# Patient Record
Sex: Male | Born: 1980
Health system: Southern US, Community
[De-identification: ages and names within clinical notes are randomized; demographics above are authoritative.]

## PROBLEM LIST (undated history)

## (undated) DIAGNOSIS — K219 Gastro-esophageal reflux disease without esophagitis: Secondary | ICD-10-CM

## (undated) HISTORY — DX: Gastro-esophageal reflux disease without esophagitis: K21.9

---

## 2012-03-30 ENCOUNTER — Encounter: Payer: Self-pay | Admitting: Family Medicine

## 2012-03-30 ENCOUNTER — Ambulatory Visit (INDEPENDENT_AMBULATORY_CARE_PROVIDER_SITE_OTHER): Payer: No Typology Code available for payment source | Admitting: Family Medicine

## 2012-03-30 VITALS — BP 120/78 | HR 72 | Temp 98.7°F | Resp 12 | Ht 69.0 in | Wt 190.0 lb

## 2012-03-30 DIAGNOSIS — K219 Gastro-esophageal reflux disease without esophagitis: Secondary | ICD-10-CM

## 2012-03-30 DIAGNOSIS — Z8619 Personal history of other infectious and parasitic diseases: Secondary | ICD-10-CM

## 2012-03-30 MED ORDER — OMEPRAZOLE 20 MG PO CPDR: 20.0000 mg | DELAYED_RELEASE_CAPSULE | Freq: Every day | ORAL | Status: DC

## 2012-03-30 MED ORDER — VALACYCLOVIR HCL 1 G PO TABS: ORAL_TABLET | ORAL | Status: DC

## 2012-03-30 NOTE — Progress Notes (Signed)
  Subjective:    Patient ID: Gregory Sparks, male    DOB: [birth year], 31 y.o.   MRN: [id number]  HPI  New to establish. Generally very healthy. He has history of reflux been treated for several years with omeprazole 20 mg daily. Each time he tries to discontinue has breakthrough symptoms. Usually very cautious with foods. Nonsmoker. Rare alcohol. Very little caffeine consumption. Has had excellent control with omeprazole.  History of recurrent cold sores. Uses Valtrex as needed. No other chronic medical problems. No prior surgeries.  Family history significant for parents with hypertension. Uncle with type 2 diabetes. Patient is single. Nonsmoker. Works as an Product/process development scientist with the [employer]  Past Medical History  Diagnosis Date  . GERD (gastroesophageal reflux disease)    History reviewed. No pertinent past surgical history.  reports that he has never smoked. He does not have any smokeless tobacco history on file. His alcohol and drug histories not on file. family history includes Alcohol abuse in his father, maternal grandmother, and mother; Hyperlipidemia in his father and mother; and Hypertension in his father, maternal grandmother, and mother. No Known Allergies    Review of Systems  Constitutional: Negative for appetite change and unexpected weight change.  Respiratory: Negative for cough and shortness of breath.   Cardiovascular: Negative for chest pain.  Gastrointestinal: Negative for abdominal pain.  Genitourinary: Negative for dysuria.  Neurological: Negative for dizziness and headaches.       Objective:   Physical Exam  Constitutional: He is oriented to person, place, and time. He appears well-developed and well-nourished.  HENT:  Right Ear: External ear normal.  Left Ear: External ear normal.  Mouth/Throat: Oropharynx is clear and moist.  Neck: Neck supple. No thyromegaly present.  Cardiovascular: Normal rate and regular rhythm.   Pulmonary/Chest: Effort normal and  breath sounds normal. No respiratory distress. He has no wheezes. He has no rales.  Abdominal: Soft. He exhibits no mass. There is no tenderness. There is no rebound and no guarding.  Musculoskeletal: He exhibits no edema.  Neurological: He is alert and oriented to person, place, and time. No cranial nerve deficit.          Assessment & Plan:  #1 GERD. Refill omeprazole for one year. Dietary factors reviewed. #2 recurrent cold sores. Refilled Valtrex for as needed use

## 2012-03-31 ENCOUNTER — Encounter: Payer: Self-pay | Admitting: Family Medicine

## 2013-01-06 ENCOUNTER — Other Ambulatory Visit: Payer: Self-pay | Admitting: Family Medicine

## 2013-01-20 ENCOUNTER — Ambulatory Visit (INDEPENDENT_AMBULATORY_CARE_PROVIDER_SITE_OTHER): Payer: No Typology Code available for payment source | Admitting: Family Medicine

## 2013-01-20 ENCOUNTER — Encounter: Payer: Self-pay | Admitting: Family Medicine

## 2013-01-20 ENCOUNTER — Ambulatory Visit (INDEPENDENT_AMBULATORY_CARE_PROVIDER_SITE_OTHER)
Admission: RE | Admit: 2013-01-20 | Discharge: 2013-01-20 | Disposition: A | Discharge status: Home / Self Care | Payer: No Typology Code available for payment source | Source: Ambulatory Visit | Attending: Family Medicine | Admitting: Family Medicine

## 2013-01-20 VITALS — BP 130/84 | Temp 98.0°F | Wt 196.0 lb

## 2013-01-20 DIAGNOSIS — M898X8 Other specified disorders of bone, other site: Secondary | ICD-10-CM

## 2013-01-20 DIAGNOSIS — M899 Disorder of bone, unspecified: Secondary | ICD-10-CM

## 2013-01-20 DIAGNOSIS — M949 Disorder of cartilage, unspecified: Secondary | ICD-10-CM

## 2013-01-20 NOTE — Progress Notes (Signed)
  Subjective:    Patient ID: Gregory Sparks, male    DOB: August 20, 1981, 32 y.o.   MRN: [id number]  HPI Right hip pain-just above right iliac crest Onset November 2013.  First noted after running hard on treadmill-12MPH interval training. Pain is above iliac crest. Worse with running but still running some. Relief with rest. Pain is sharp and off and on.   Denies any lumbar back pain. No abdominal pain. Denies fever, chills, urinary symptoms, abdominal pain, appetite, or weight changes.  Pain is 5/10 intensity at its worst. Possibly mild relief with ibuprofen-though he rarely takes. He is able to do some exercises such as squats without difficulty  Past Medical History  Diagnosis Date  . GERD (gastroesophageal reflux disease)    No past surgical history on file.  reports that he has never smoked. He does not have any smokeless tobacco history on file. His alcohol and drug histories are not on file. family history includes Alcohol abuse in his father, maternal grandmother, and mother; Hyperlipidemia in his father and mother; and Hypertension in his father, maternal grandmother, and mother. No Known Allergies    Review of Systems  Constitutional: Negative for fever, chills, appetite change and unexpected weight change.  Cardiovascular: Negative for leg swelling.  Gastrointestinal: Negative for abdominal pain.  Genitourinary: Negative for dysuria.  Musculoskeletal: Negative for back pain.  Skin: Negative for rash.  Hematological: Negative for adenopathy.       Objective:   Physical Exam  Constitutional: He appears well-developed and well-nourished.  Cardiovascular: Normal rate and regular rhythm.   Pulmonary/Chest: Effort normal and breath sounds normal. No respiratory distress. He has no wheezes. He has no rales.  Abdominal: Soft. He exhibits no distension and no mass. There is no tenderness. There is no rebound and no guarding.  Musculoskeletal:  Full range of motion right hip  without difficulty He has some mild tenderness palpating the superior aspect right iliac crest. No masses palpated. No ecchymosis. No lateral hip tenderness over IT band          Assessment & Plan:  Patient presents with several month history of pain above right iliac crest first noted after running. He initially thought this was more of a muscular injury but this has lasted several months. Not having any true hip pain. No involvement of IT band. We'll get x-rays of pelvis given duration of symptoms. Consider referral to sports medicine clinic.

## 2013-01-21 NOTE — Progress Notes (Signed)
Quick Note:  Pt informed ______ 

## 2013-02-24 ENCOUNTER — Ambulatory Visit (INDEPENDENT_AMBULATORY_CARE_PROVIDER_SITE_OTHER): Payer: No Typology Code available for payment source | Admitting: Sports Medicine

## 2013-02-24 ENCOUNTER — Encounter: Payer: Self-pay | Admitting: Sports Medicine

## 2013-02-24 VITALS — BP 114/65 | Ht 69.0 in | Wt 185.0 lb

## 2013-02-24 DIAGNOSIS — M6289 Other specified disorders of muscle: Secondary | ICD-10-CM | POA: Insufficient documentation

## 2013-02-24 DIAGNOSIS — M6580 Other synovitis and tenosynovitis, unspecified site: Secondary | ICD-10-CM

## 2013-02-24 MED ORDER — NITROGLYCERIN 0.2 MG/HR TD PT24: MEDICATED_PATCH | TRANSDERMAL | Status: DC

## 2013-02-24 NOTE — Assessment & Plan Note (Signed)
Patient actually does have a tear in the tensor fascia lata. Patient will try and nitroglycerin patch and warned of potential side effects. Patient given home exercise program and her hand out to try. Discussed how to alter different lifting techniques. Patient will then followup again in 6 weeks to make sure that he continues to improve. At that time we should rescan the area of injury.

## 2013-02-24 NOTE — Patient Instructions (Signed)
Do the exercises we talked about Drop weight to 50% and increase by 10% weekly Nitroglycerin Protocol   Apply 1/4 nitroglycerin patch to affected area daily.  Change position of patch within the affected area every 24 hours.  You may experience a headache during the first 1-2 weeks of using the patch, these should subside.  If you experience headaches after beginning nitroglycerin patch treatment, you may take your preferred over the counter pain reliever.  Another side effect of the nitroglycerin patch is skin irritation or rash related to patch adhesive.  Please notify our office if you develop more severe headaches or rash, and stop the patch.  Tendon healing with nitroglycerin patch may require 12 to 24 weeks depending on the extent of injury.  Men should not use if taking Viagra, Cialis, or Levitra.   Do not use if you have migraines or rosacea.   Come back again in 6 weeks to make sure you are improving.

## 2013-02-24 NOTE — Progress Notes (Signed)
Chief complaint right hip pain  History of present illness: Patient is a 32 year old male coming in with right hip pain for several months duration. Patient states that he noticed it initially when he was running on a treadmill 12 miles per hour doing interval training. Patient states he did have pain but it seemed to subside but unfortunately 48 hours after that he started to have significant pain. Patient did take 3 weeks off which didn't completely resolved the pain in this area. Patient though since that time has resumed his regular activities and states that after activity as well as after sitting for long periods of time he can have pain on the lateral superior aspect of the hip. Patient states that this can be somewhat tender to palpation. Patient describes his pain as more of a tearing sensation. Patient has tried ice with some moderate improvement. Patient has not taken any over-the-counter medications.  Past Medical History  Diagnosis Date  . GERD (gastroesophageal reflux disease)    No past surgical history on file. Family History  Problem Relation Age of Onset  . Alcohol abuse Mother   . Hypertension Mother   . Hyperlipidemia Mother   . Alcohol abuse Father   . Hypertension Father   . Hyperlipidemia Father   . Alcohol abuse Maternal Grandmother   . Hypertension Maternal Grandmother    History   Social History  . Marital Status: Unknown    Spouse Name: N/A    Number of Children: N/A  . Years of Education: N/A   Social History Main Topics  . Smoking status: Never Smoker   . Smokeless tobacco: Not on file  . Alcohol Use: Not on file  . Drug Use: Not on file  . Sexually Active: Not on file   Other Topics Concern  . Not on file   Social History Narrative  . No narrative on file    Physical exam Blood pressure 114/65, height 5\' 9"  (1.753 m), weight 185 lb (83.915 kg). General: No apparent distress alert and oriented x3 mood and affect normal Respiratory: Patient's  speak in full sentences and does not appear short of breath Skin: Warm dry intact with no signs of infection or rash Neuro: Cranial nerves II through XII are intact, neurovascularly intact in all extremities with 2+ DTRs and 2+ pulses. Right hip exam: On inspection there is no gross deformity. Patient has full range of motion of the hip with no crepitus. Patient has no pain with labral testing. He is neurovascularly intact distally with 2+ DTRs. Patient though is somewhat tender to palpation over the iliac crest on the lateral aspect near the insertion of the anterior aspect of the tensor fascia lata. No swelling is noted. Patient on the skin does have a birthmark approximately 4 cm proximal to this area.  Musculoskeletal ultrasound was performed and interpreted by me today. Patient's in this vicinity of pain at the insertion of the tensor fossa left eye on the iliac crest shows an area of hypoechoic changes with calcific changes. This does have some neovascularization as well. This appeared the patient did have a minor tear in this area as well. The area of defect is approximately 1 cm in diameter. Patient also has an area on the underlying iliac crest appears to be him an avulsion fracture versus a nonunion.

## 2013-04-06 ENCOUNTER — Ambulatory Visit: Payer: No Typology Code available for payment source | Admitting: Sports Medicine

## 2013-04-09 ENCOUNTER — Other Ambulatory Visit: Payer: Self-pay | Admitting: Family Medicine

## 2013-04-28 ENCOUNTER — Encounter: Payer: Self-pay | Admitting: Sports Medicine

## 2013-04-28 ENCOUNTER — Ambulatory Visit (INDEPENDENT_AMBULATORY_CARE_PROVIDER_SITE_OTHER): Payer: No Typology Code available for payment source | Admitting: Sports Medicine

## 2013-04-28 VITALS — BP 132/82 | HR 67 | Ht 69.0 in | Wt 185.0 lb

## 2013-04-28 DIAGNOSIS — M6580 Other synovitis and tenosynovitis, unspecified site: Secondary | ICD-10-CM

## 2013-04-28 DIAGNOSIS — M6289 Other specified disorders of muscle: Secondary | ICD-10-CM

## 2013-04-28 NOTE — Patient Instructions (Signed)
Thank you for coming in today  Stop NTG  Wear insoles in all possible shoes  Exercises:  - Bar dips and lateral bending - Side planks  - Lateral leg lifts - Straight leg raises - Standing hip rotations  Return in 1 month if continued symptoms It is okay to run if you are not limping and your pain is < 3/10

## 2013-04-28 NOTE — Progress Notes (Signed)
CC: Followup right tensor fascia lata syndrome, new complaint of similar symptoms on the left. HPI: Patient is a pleasant 32 year old male who presents for followup today. When I last saw him he was complaining of right-sided hip pain at the ASIS. On ultrasound he has hypoechoic changes and a tear in the tensor fascia lata. He was started on the nitroglycerin protocol as well as home exercises. He states that his right hip is now 95% improved. He has completed the nitroglycerin. He states that overall his right side feels great. However, he tried to go for a short low intensity run on Saturday and ran about 2 miles at an 8-1/2 minute mile pace. He started to develop the same pain on the left side. He is concerned about why this is happening again.  ROS: As above in the HPI. All other systems are stable or negative.  OBJECTIVE: APPEARANCE:  Patient in no acute distress.The patient appeared well nourished and normally developed. HEENT: No scleral icterus. Conjunctiva non-injected Resp: Non labored Skin: No rash MSK:  Right hip: Full range of motion without pain. No tenderness to palpation over the ASIS or tendinous attachments. No pain on hip flexion against resistance or hip abduction against resistance. Strength is 5 out of 5. Left hip: Full range of motion without pain. There is tenderness to palpation near the ASIS. Patient has no pain and normal strength on hip flexion against resistance. There is pain on hip abduction against resistance with the hip in a slightly extended position.  Gait examination shows mild pronation of the feet bilaterally, right greater than left. Patient is noted to have hammering of his fourth and fifth toes on the right. He also has deviation of the second and third toes on the right foot  MSK [place]: Ultrasound of the right and left ASIS, iliac crest, and tensor fascia lata was performed in transverse and longitudinal views. There is near-complete resolution of the  hypoechoic change at the right iliac crest and ASIS. No tears visualized within the tensor fascial lata. On the left side there is increased hypoechoic change along the iliac crest just posterior to the ASIS. This is also the point of maximum tenderness.  ASSESSMENT: #1. Bilateral tensor fascia lata syndrome likely secondary to gait abnormality with over pronation   PLAN: Patient was given a sport insoles with small scaphoid pad to wear in his shoes. He was also given a increase home exercise plan to do bilaterally including bar dips and lateral bending as well as lateral leg lifts, straight leg raise, and standing hip rotation. He may continue his planks and core work. He may increase his activities so long as his pain is less than a 3/10 and he is not limping. He will followup in one month. Stop NTG

## 2013-06-08 ENCOUNTER — Ambulatory Visit: Payer: No Typology Code available for payment source | Admitting: Sports Medicine

## 2013-10-22 ENCOUNTER — Telehealth: Payer: Self-pay | Admitting: Family Medicine

## 2013-10-22 MED ORDER — VALACYCLOVIR HCL 1 G PO TABS: ORAL_TABLET | ORAL | Status: DC

## 2013-10-22 NOTE — Telephone Encounter (Signed)
Pharm request valACYclovir (VALTREX) 1000 MG tablet for pt.  Pt is transfering this rx and no refills. [facility]

## 2013-10-22 NOTE — Telephone Encounter (Signed)
RX sent to pharmacy  

## 2014-04-18 ENCOUNTER — Encounter: Payer: Self-pay | Admitting: Family Medicine

## 2014-04-18 ENCOUNTER — Ambulatory Visit (INDEPENDENT_AMBULATORY_CARE_PROVIDER_SITE_OTHER): Payer: No Typology Code available for payment source | Admitting: Family Medicine

## 2014-04-18 VITALS — BP 126/80 | HR 71 | Temp 98.1°F | Wt 201.0 lb

## 2014-04-18 DIAGNOSIS — H938X9 Other specified disorders of ear, unspecified ear: Secondary | ICD-10-CM

## 2014-04-18 DIAGNOSIS — H938X3 Other specified disorders of ear, bilateral: Secondary | ICD-10-CM

## 2014-04-18 NOTE — Patient Instructions (Signed)
Try over the counter decongestant such as Sudafed.   Follow up for any sudden hearing loss or other new symptoms.

## 2014-04-18 NOTE — Progress Notes (Signed)
Pre visit review using our clinic review tool, if applicable. No additional management support is needed unless otherwise documented below in the visit note. 

## 2014-04-18 NOTE — Progress Notes (Signed)
   Subjective:    Patient ID: Gregory Sparks, male    DOB: 06/21/1981, 33 y.o.   MRN: [id number]  Ear Fullness  Pertinent negatives include no ear discharge or sore throat.   Acute visit. Bilateral ear fullness off and on for a week. 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 He has a mild discomfort off and on. No drainage. No effusion or chills. Some nasal congestion. He is taking Claritin. Has not tried any decongestants.  Past Medical History  Diagnosis Date  . GERD (gastroesophageal reflux disease)    No past surgical history on file.  reports that he has never smoked. He does not have any smokeless tobacco history on file. His alcohol and drug histories are not on file. family history includes Alcohol abuse in his father, maternal grandmother, and mother; Hyperlipidemia in his father and mother; Hypertension in his father, maternal grandmother, and mother. No Known Allergies    Review of Systems  Constitutional: Negative for fever and chills.  HENT: Positive for congestion and ear pain. Negative for ear discharge and sore throat.        Objective:   Physical Exam  Constitutional: He appears well-developed and well-nourished.  HENT:  Right Ear: External ear normal.  Left Ear: External ear normal.  Mouth/Throat: Oropharynx is clear and moist.  Neck: Neck supple.  Lymphadenopathy:    He has no cervical adenopathy.          Assessment & Plan:  Bilateral ear pressure. Normal exam. No signs of effusion. No cerumen. Question eustachian tube dysfunction. We've recommended trying Sudafed and continue Claritin. Touch base in one to 2 weeks if no improvement

## 2014-04-27 ENCOUNTER — Other Ambulatory Visit: Payer: Self-pay

## 2014-04-27 MED ORDER — OMEPRAZOLE 20 MG PO CPDR: DELAYED_RELEASE_CAPSULE | ORAL | Status: DC

## 2014-07-05 ENCOUNTER — Encounter: Payer: Self-pay | Admitting: Family Medicine

## 2014-07-05 ENCOUNTER — Ambulatory Visit (INDEPENDENT_AMBULATORY_CARE_PROVIDER_SITE_OTHER): Payer: No Typology Code available for payment source | Admitting: Family Medicine

## 2014-07-05 VITALS — BP 130/80 | HR 84 | Temp 98.0°F | Wt 199.0 lb

## 2014-07-05 DIAGNOSIS — J3089 Other allergic rhinitis: Secondary | ICD-10-CM

## 2014-07-05 DIAGNOSIS — J309 Allergic rhinitis, unspecified: Secondary | ICD-10-CM

## 2014-07-05 MED ORDER — AZELASTINE HCL 0.1 % NA SOLN: 2.0000 | Freq: Two times a day (BID) | NASAL | Status: DC

## 2014-07-05 NOTE — Patient Instructions (Signed)

## 2014-07-05 NOTE — Progress Notes (Signed)
Pre visit review using our clinic review tool, if applicable. No additional management support is needed unless otherwise documented below in the visit note. 

## 2014-07-05 NOTE — Progress Notes (Signed)
   Subjective:    Patient ID: Gregory Sparks, male    DOB: 03/30/1981, 33 y.o.   MRN: [id number]  HPI Acute visit. Patient is seen with probable allergy symptoms. He's had several weeks of watery itchy eyes and nasal congestion and postnasal drip. Occasional nonproductive cough. No fever. His tried over-the-counter Claritin without much relief. He's taken Flonase, though somewhat inconsistently. He's not had any facial pain or any colored nasal discharge.His allergy symptoms tend to be year-round a thinks this is probably largely related to dust allergy  Past Medical History  Diagnosis Date  . GERD (gastroesophageal reflux disease)    No past surgical history on file.  reports that he has never smoked. He does not have any smokeless tobacco history on file. His alcohol and drug histories are not on file. family history includes Alcohol abuse in his father, maternal grandmother, and mother; Hyperlipidemia in his father and mother; Hypertension in his father, maternal grandmother, and mother. No Known Allergies    Review of Systems  Constitutional: Negative for fever and chills.  HENT: Positive for congestion, postnasal drip and rhinorrhea.   Respiratory: Positive for cough. Negative for shortness of breath and wheezing.        Objective:   Physical Exam  Constitutional: He appears well-developed and well-nourished.  HENT:  Right Ear: External ear normal.  Left Ear: External ear normal.  Nose: Nose normal.  Mouth/Throat: Oropharynx is clear and moist.  Neck: Neck supple.  Cardiovascular: Normal rate and regular rhythm.   Pulmonary/Chest: Effort normal and breath sounds normal. No respiratory distress. He has no wheezes. He has no rales.  Lymphadenopathy:    He has no cervical adenopathy.          Assessment & Plan:  Allergic rhinitis, perennial. Continue Claritin. Continue Flonase and take more regularly. Add Astelin 1-2 sprays per nostril twice daily as needed. Consider  Singulair if not responding to the above

## 2014-08-19 ENCOUNTER — Encounter: Payer: Self-pay | Admitting: Family Medicine

## 2014-08-19 ENCOUNTER — Ambulatory Visit (INDEPENDENT_AMBULATORY_CARE_PROVIDER_SITE_OTHER): Payer: No Typology Code available for payment source | Admitting: Family Medicine

## 2014-08-19 VITALS — BP 124/70 | HR 87 | Temp 98.4°F | Wt 199.0 lb

## 2014-08-19 DIAGNOSIS — J069 Acute upper respiratory infection, unspecified: Secondary | ICD-10-CM

## 2014-08-19 NOTE — Patient Instructions (Signed)
Viral Infections A viral infection can be caused by different types of viruses.Most viral infections are not serious and resolve on their own. However, some infections may cause severe symptoms and may lead to further complications. SYMPTOMS Viruses can frequently cause:  Minor sore throat.  Aches and pains.  Headaches.  Runny nose.  Different types of rashes.  Watery eyes.  Tiredness.  Cough.  Loss of appetite.  Gastrointestinal infections, resulting in nausea, vomiting, and diarrhea. These symptoms do not respond to antibiotics because the infection is not caused by bacteria. However, you might catch a bacterial infection following the viral infection. This is sometimes called a "superinfection." Symptoms of such a bacterial infection may include:  Worsening sore throat with pus and difficulty swallowing.  Swollen neck glands.  Chills and a high or persistent fever.  Severe headache.  Tenderness over the sinuses.  Persistent overall ill feeling (malaise), muscle aches, and tiredness (fatigue).  Persistent cough.  Yellow, green, or brown mucus production with coughing. HOME CARE INSTRUCTIONS   Only take over-the-counter or prescription medicines for pain, discomfort, diarrhea, or fever as directed by your caregiver.  Drink enough water and fluids to keep your urine clear or pale yellow. Sports drinks can provide valuable electrolytes, sugars, and hydration.  Get plenty of rest and maintain proper nutrition. Soups and broths with crackers or rice are fine. SEEK IMMEDIATE MEDICAL CARE IF:   You have severe headaches, shortness of breath, chest pain, neck pain, or an unusual rash.  You have uncontrolled vomiting, diarrhea, or you are unable to keep down fluids.  You or your child has an oral temperature above 102 F (38.9 C), not controlled by medicine.  Your baby is older than 3 months with a rectal temperature of 102 F (38.9 C) or higher.  Your baby is 673  months old or younger with a rectal temperature of 100.4 F (38 C) or higher. MAKE SURE YOU:   Understand these instructions.  Will watch your condition.  Will get help right away if you are not doing well or get worse. Document Released: 05/29/2005 Document Revised: 11/11/2011 Document Reviewed: 12/24/2010 [facility] Patient Information 2015 [place], [place]. This information is not intended to replace advice given to you by your health care provider. Make sure you discuss any questions you have with your health care provider.  Consider OTC Mucinex and Nettie pot with saline irrigation. Continue with decongestant as needed.

## 2014-08-19 NOTE — Progress Notes (Signed)
   Subjective:    Patient ID: Gregory Sparks, male    DOB: 08/02/1981, 33 y.o.   MRN: [id number]  HPI  acute visit. Patient seen with couple day history of cough, nasal congestion, possible low-grade fever, intermittent headaches, bilateral diffuse facial pressure and increased malaise. He's tried Claritin-D without much improvement. Denies any nausea, vomiting, or diarrhea. He has allergy tendencies and takes Astelin nasal at baseline.  Past Medical History  Diagnosis Date  . GERD (gastroesophageal reflux disease)    No past surgical history on file.  reports that he has never smoked. He does not have any smokeless tobacco history on file. His alcohol and drug histories are not on file. family history includes Alcohol abuse in his father, maternal grandmother, and mother; Hyperlipidemia in his father and mother; Hypertension in his father, maternal grandmother, and mother. No Known Allergies   Review of Systems  Constitutional: Positive for fever and fatigue.  HENT: Positive for congestion.   Respiratory: Positive for cough.   Neurological: Positive for headaches.       Objective:   Physical Exam  Constitutional: He appears well-developed and well-nourished.  HENT:  Right Ear: External ear normal.  Left Ear: External ear normal.  Mouth/Throat: Oropharynx is clear and moist.  Neck: Neck supple. No thyromegaly present.  Cardiovascular: Normal rate and regular rhythm.   Pulmonary/Chest: Effort normal and breath sounds normal. No respiratory distress. He has no wheezes. He has no rales.  Lymphadenopathy:    He has no cervical adenopathy.          Assessment & Plan:  Probable viral syndrome. We recommend continue  over-the-counter decongestant. Consider over-the-counter Mucinex. Consider Nettie pot with saline irrigation with distilled water.

## 2014-08-19 NOTE — Progress Notes (Signed)
Pre visit review using our clinic review tool, if applicable. No additional management support is needed unless otherwise documented below in the visit note. 

## 2014-09-30 ENCOUNTER — Other Ambulatory Visit: Payer: Self-pay | Admitting: Family Medicine

## 2014-11-09 ENCOUNTER — Ambulatory Visit (INDEPENDENT_AMBULATORY_CARE_PROVIDER_SITE_OTHER): Payer: No Typology Code available for payment source | Admitting: Internal Medicine

## 2014-11-09 ENCOUNTER — Encounter: Payer: Self-pay | Admitting: Internal Medicine

## 2014-11-09 VITALS — BP 130/80 | HR 95 | Temp 102.3°F | Resp 20 | Ht 69.0 in | Wt 204.0 lb

## 2014-11-09 DIAGNOSIS — R519 Headache, unspecified: Secondary | ICD-10-CM

## 2014-11-09 DIAGNOSIS — B9789 Other viral agents as the cause of diseases classified elsewhere: Principal | ICD-10-CM

## 2014-11-09 DIAGNOSIS — R51 Headache: Secondary | ICD-10-CM | POA: Diagnosis not present

## 2014-11-09 DIAGNOSIS — R509 Fever, unspecified: Secondary | ICD-10-CM

## 2014-11-09 DIAGNOSIS — R52 Pain, unspecified: Secondary | ICD-10-CM

## 2014-11-09 DIAGNOSIS — J069 Acute upper respiratory infection, unspecified: Secondary | ICD-10-CM | POA: Diagnosis not present

## 2014-11-09 LAB — POCT INFLUENZA A/B
Influenza A, POC: NEGATIVE
Influenza B, POC: NEGATIVE

## 2014-11-09 MED ORDER — HYDROCODONE-ACETAMINOPHEN 5-325 MG PO TABS: 1.0000 | ORAL_TABLET | Freq: Four times a day (QID) | ORAL | Status: DC | PRN

## 2014-11-09 MED ORDER — CETIRIZINE HCL 10 MG PO TABS: 10.0000 mg | ORAL_TABLET | Freq: Every day | ORAL | Status: DC

## 2014-11-09 NOTE — Progress Notes (Signed)
Subjective:    Patient ID: Gregory Sparks, male    DOB: 08-Nov-1980, 34 y.o.   MRN: [id number]  HPI 34 year old patient who presents with a day and a half history of fever, cough, generalized myalgias, headache.  Cough is minor and nonproductive.  He feels quite weak with the anorexia. He has recently returned from [place], planning his wedding and was in contact with people with acute febrile illness  Past Medical History  Diagnosis Date  . GERD (gastroesophageal reflux disease)     History   Social History  . Marital Status: Unknown    Spouse Name: N/A  . Number of Children: N/A  . Years of Education: N/A   Occupational History  . Not on file.   Social History Main Topics  . Smoking status: Never Smoker   . Smokeless tobacco: Not on file  . Alcohol Use: Not on file  . Drug Use: Not on file  . Sexual Activity: Not on file   Other Topics Concern  . Not on file   Social History Narrative    No past surgical history on file.  Family History  Problem Relation Age of Onset  . Alcohol abuse Mother   . Hypertension Mother   . Hyperlipidemia Mother   . Alcohol abuse Father   . Hypertension Father   . Hyperlipidemia Father   . Alcohol abuse Maternal Grandmother   . Hypertension Maternal Grandmother     No Known Allergies  Current Outpatient Prescriptions on File Prior to Visit  Medication Sig Dispense Refill  . omeprazole (PRILOSEC) 20 MG capsule take 1 capsule by mouth once daily 90 capsule 2  . valACYclovir (VALTREX) 1000 MG tablet USE AS DIRECTED IF NEEDED FOR COLD SORES 60 tablet 1   No current facility-administered medications on file prior to visit.    BP 130/80 mmHg  Pulse 95  Temp(Src) 102.3 F (39.1 C) (Oral)  Resp 20  Ht  (1.753 m)  Wt 204 lb (92.534 kg)  BMI 30.11 kg/m2  SpO2 98%      Review of Systems  Constitutional: Positive for fever, activity change, appetite change and fatigue. Negative for chills.  HENT: Positive for congestion.  Negative for dental problem, ear pain, hearing loss, sore throat, tinnitus, trouble swallowing and voice change.   Eyes: Negative for pain, discharge and visual disturbance.  Respiratory: Positive for cough. Negative for chest tightness, wheezing and stridor.   Cardiovascular: Negative for chest pain, palpitations and leg swelling.  Gastrointestinal: Negative for nausea, vomiting, abdominal pain, diarrhea, constipation, blood in stool and abdominal distention.  Genitourinary: Negative for urgency, hematuria, flank pain, discharge, difficulty urinating and genital sores.  Musculoskeletal: Positive for myalgias. Negative for back pain, joint swelling, arthralgias, gait problem and neck stiffness.  Skin: Negative for rash.  Neurological: Negative for dizziness, syncope, speech difficulty, weakness, numbness and headaches.  Hematological: Negative for adenopathy. Does not bruise/bleed easily.  Psychiatric/Behavioral: Negative for behavioral problems and dysphoric mood. The patient is not nervous/anxious.        Objective:   Physical Exam  Constitutional: He is oriented to person, place, and time. He appears well-developed.  HENT:  Head: Normocephalic.  Right Ear: External ear normal.  Left Ear: External ear normal.  Eyes: Conjunctivae and EOM are normal.  Neck: Normal range of motion.  Cardiovascular: Normal rate and normal heart sounds.   Pulmonary/Chest: Breath sounds normal. No respiratory distress. He has no wheezes. He has no rales.  Abdominal: Bowel sounds are normal.  Musculoskeletal: Normal range of motion. He exhibits no edema or tenderness.  Neurological: He is alert and oriented to person, place, and time.  Psychiatric: He has a normal mood and affect. His behavior is normal.          Assessment & Plan:

## 2014-11-09 NOTE — Progress Notes (Signed)
Pre visit review using our clinic review tool, if applicable. No additional management support is needed unless otherwise documented below in the visit note. 

## 2014-11-09 NOTE — Patient Instructions (Signed)
Acute bronchitis symptoms for less than 10 days are generally not helped by antibiotics.  Take over-the-counter expectorants and cough medications such as  Mucinex DM.  Call if there is no improvement in 5 to 7 days or if  you develop worsening cough, fever, or new symptoms, such as shortness of breath or chest pain.  TREATMENT  Acute bronchitis usually goes away in a couple weeks. Oftentimes, no medical treatment is necessary. Medicines are sometimes given for relief of fever or cough. Antibiotic medicines are usually not needed but may be prescribed in certain situations. In some cases, an inhaler may be recommended to help reduce shortness of breath and control the cough. A cool mist vaporizer may also be used to help thin bronchial secretions and make it easier to clear the chest.   HOME CARE INSTRUCTIONS  Get plenty of rest.  Drink enough fluids to keep your urine clear or pale yellow (unless you have a medical condition that requires fluid restriction). Increasing fluids may help thin your respiratory secretions (sputum) and reduce chest congestion, and it will prevent dehydration.  Take medicines only as directed by your health care provider.  Reduce the chances of another bout of acute bronchitis by washing your hands frequently, avoiding people with cold symptoms, and trying not to touch your hands to your mouth, nose, or eyes.   SEEK MEDICAL CARE IF:  Your symptoms do not improve after 1 week of treatment.

## 2015-03-15 ENCOUNTER — Other Ambulatory Visit: Payer: Self-pay | Admitting: Family Medicine

## 2015-11-06 ENCOUNTER — Ambulatory Visit (INDEPENDENT_AMBULATORY_CARE_PROVIDER_SITE_OTHER): Payer: No Typology Code available for payment source | Admitting: Family Medicine

## 2015-11-06 VITALS — BP 120/88 | HR 68 | Temp 97.8°F | Ht 69.0 in | Wt 200.4 lb

## 2015-11-06 DIAGNOSIS — R42 Dizziness and giddiness: Secondary | ICD-10-CM | POA: Diagnosis not present

## 2015-11-06 NOTE — Progress Notes (Signed)
   Subjective:    Patient ID: Gregory Sparks, male    DOB: 01/11/1981, 35 y.o.   MRN: [id number]  HPI Acute visit for vertigo. He had some very mild symptoms last week but especially worse on Sunday Symptoms are somewhat improved today. He describes vertigo symptoms that were worse with head movement and also worse supine. No clear directional component. No visual changes. No nausea. No vomiting. No headaches. No syncope. No hearing changes. No recent nasal congestion. Denies fever or chills. No focal weakness No prior history of vertigo .  Did have some mild headache over the weekend but none today  Past Medical History  Diagnosis Date  . GERD (gastroesophageal reflux disease)    No past surgical history on file.  reports that he has never smoked. He does not have any smokeless tobacco history on file. His alcohol and drug histories are not on file. family history includes Alcohol abuse in his father, maternal grandmother, and mother; Hyperlipidemia in his father and mother; Hypertension in his father, maternal grandmother, and mother. No Known Allergies    Review of Systems  Constitutional: Negative for fever and chills.  HENT: Negative for ear discharge, ear pain, hearing loss and tinnitus.   Eyes: Negative for visual disturbance.  Cardiovascular: Negative for chest pain.  Neurological: Positive for dizziness. Negative for seizures, syncope, weakness and headaches.  Psychiatric/Behavioral: Negative for confusion.       Objective:   Physical Exam  Constitutional: He is oriented to person, place, and time. He appears well-developed and well-nourished.  HENT:  Right Ear: External ear normal.  Left Ear: External ear normal.  Eyes: Pupils are equal, round, and reactive to light.  Neck: Neck supple.  Cardiovascular: Normal rate and regular rhythm.   No murmur heard. Pulmonary/Chest: Effort normal and breath sounds normal. No respiratory distress. He has no wheezes. He has no  rales.  Musculoskeletal: He exhibits no edema.  Lymphadenopathy:    He has no cervical adenopathy.  Neurological: He is alert and oriented to person, place, and time. No cranial nerve deficit. Coordination normal.  No focal strength deficits. Gait normal. Cerebellar normal by finger-nose testing No nystagmus.          Assessment & Plan:  Vertigo. Suspect benign peripheral positional vertigo. Nonfocal neuro exam at this time. Handout given. Follow-up for any recurrence or new symptoms. Handout on Epley maneuvers given. Consider vestibular rehabilitation if symptoms persist

## 2015-11-06 NOTE — Progress Notes (Signed)
Pre visit review using our clinic review tool, if applicable. No additional management support is needed unless otherwise documented below in the visit note. 

## 2015-11-06 NOTE — Patient Instructions (Signed)
Benign Positional Vertigo Vertigo is the feeling that you or your surroundings are moving when they are not. Benign positional vertigo is the most common form of vertigo. The cause of this condition is not serious (is benign). This condition is triggered by certain movements and positions (is positional). This condition can be dangerous if it occurs while you are doing something that could endanger you or others, such as driving.  CAUSES In many cases, the cause of this condition is not known. It may be caused by a disturbance in an area of the inner ear that helps your brain to sense movement and balance. This disturbance can be caused by a viral infection (labyrinthitis), head injury, or repetitive motion. RISK FACTORS This condition is more likely to develop in:  Women.  People who are 50 years of age or older. SYMPTOMS Symptoms of this condition usually happen when you move your head or your eyes in different directions. Symptoms may start suddenly, and they usually last for less than a minute. Symptoms may include:  Loss of balance and falling.  Feeling like you are spinning or moving.  Feeling like your surroundings are spinning or moving.  Nausea and vomiting.  Blurred vision.  Dizziness.  Involuntary eye movement (nystagmus). Symptoms can be mild and cause only slight annoyance, or they can be severe and interfere with daily life. Episodes of benign positional vertigo may return (recur) over time, and they may be triggered by certain movements. Symptoms may improve over time. DIAGNOSIS This condition is usually diagnosed by medical history and a physical exam of the head, neck, and ears. You may be referred to a health care provider who specializes in ear, nose, and throat (ENT) problems (otolaryngologist) or a provider who specializes in disorders of the nervous system (neurologist). You may have additional testing, including:  MRI.  A CT scan.  Eye movement tests. Your  health care provider may ask you to change positions quickly while he or she watches you for symptoms of benign positional vertigo, such as nystagmus. Eye movement may be tested with an electronystagmogram (ENG), caloric stimulation, the Dix-Hallpike test, or the roll test.  An electroencephalogram (EEG). This records electrical activity in your brain.  Hearing tests. TREATMENT Usually, your health care provider will treat this by moving your head in specific positions to adjust your inner ear back to normal. Surgery may be needed in severe cases, but this is rare. In some cases, benign positional vertigo may resolve on its own in 2-4 weeks. HOME CARE INSTRUCTIONS Safety  Move slowly.Avoid sudden body or head movements.  Avoid driving.  Avoid operating heavy machinery.  Avoid doing any tasks that would be dangerous to you or others if a vertigo episode would occur.  If you have trouble walking or keeping your balance, try using a cane for stability. If you feel dizzy or unstable, sit down right away.  Return to your normal activities as told by your health care provider. Ask your health care provider what activities are safe for you. General Instructions  Take over-the-counter and prescription medicines only as told by your health care provider.  Avoid certain positions or movements as told by your health care provider.  Drink enough fluid to keep your urine clear or pale yellow.  Keep all follow-up visits as told by your health care provider. This is important. SEEK MEDICAL CARE IF:  You have a fever.  Your condition gets worse or you develop new symptoms.  Your family or friends   notice any behavioral changes.  Your nausea or vomiting gets worse.  You have numbness or a "pins and needles" sensation. SEEK IMMEDIATE MEDICAL CARE IF:  You have difficulty speaking or moving.  You are always dizzy.  You faint.  You develop severe headaches.  You have weakness in your  legs or arms.  You have changes in your hearing or vision.  You develop a stiff neck.  You develop sensitivity to light.   This information is not intended to replace advice given to you by your health care provider. Make sure you discuss any questions you have with your health care provider.   Document Released: 05/27/2006 Document Revised: 05/10/2015 Document Reviewed: 12/12/2014 Elsevier Interactive Patient Education 2016 [employer] Inc.  

## 2015-11-10 ENCOUNTER — Ambulatory Visit (INDEPENDENT_AMBULATORY_CARE_PROVIDER_SITE_OTHER): Payer: No Typology Code available for payment source | Admitting: Family Medicine

## 2015-11-10 ENCOUNTER — Encounter: Payer: Self-pay | Admitting: Family Medicine

## 2015-11-10 VITALS — BP 110/88 | HR 72 | Temp 97.7°F | Ht 69.0 in | Wt 200.6 lb

## 2015-11-10 DIAGNOSIS — Z83438 Family history of other disorder of lipoprotein metabolism and other lipidemia: Secondary | ICD-10-CM

## 2015-11-10 DIAGNOSIS — Z8349 Family history of other endocrine, nutritional and metabolic diseases: Secondary | ICD-10-CM

## 2015-11-10 DIAGNOSIS — R42 Dizziness and giddiness: Secondary | ICD-10-CM

## 2015-11-10 NOTE — Progress Notes (Signed)
   Subjective:    Patient ID: Gregory Sparks, male    DOB: 01/25/1981, 35 y.o.   MRN: [id number]  HPI Here for the following issues  Seen earlier this week with some vertigo. Symptoms overall improved but not resolved. No new symptoms. Symptoms worse with movement. No clear directional component. No speech change. No swallowing difficulties. No focal weakness. No headache. No hearing changes. No ringing in the ears.  Patient has family history of hyperlipidemia and hypertension. No premature CAD history. Requesting fasting lipid panel. Nonsmoker. Exercises regularly.  Past Medical History  Diagnosis Date  . GERD (gastroesophageal reflux disease)    No past surgical history on file.  reports that he has never smoked. He does not have any smokeless tobacco history on file. His alcohol and drug histories are not on file. family history includes Alcohol abuse in his father, maternal grandmother, and mother; Hyperlipidemia in his father and mother; Hypertension in his father, maternal grandmother, and mother. No Known Allergies    Review of Systems  Constitutional: Negative for chills, fatigue and unexpected weight change.  HENT: Negative for congestion and hearing loss.   Eyes: Negative for visual disturbance.  Respiratory: Negative for cough, chest tightness and shortness of breath.   Cardiovascular: Negative for chest pain, palpitations and leg swelling.  Neurological: Positive for dizziness. Negative for syncope, weakness, light-headedness and headaches.       Objective:   Physical Exam  Constitutional: He is oriented to person, place, and time. He appears well-developed and well-nourished.  HENT:  Right Ear: External ear normal.  Left Ear: External ear normal.  Mouth/Throat: Oropharynx is clear and moist.  Eyes: Pupils are equal, round, and reactive to light.  Neck: Neck supple. No thyromegaly present.  Cardiovascular: Normal rate and regular rhythm.   Pulmonary/Chest: Effort  normal and breath sounds normal. No respiratory distress. He has no wheezes. He has no rales.  Musculoskeletal: He exhibits no edema.  Neurological: He is alert and oriented to person, place, and time. No cranial nerve deficit.          Assessment & Plan:  #1 persistent vertigo. Suspect benign peripheral positional vertigo. Symptoms overall improved. If not resolving by next week consider vestibular rehabilitation  #2 family history of hyperlipidemia. Patient has never had lipids checked. Requesting lipid panel and this will be done fasting.

## 2015-11-10 NOTE — Progress Notes (Signed)
Pre visit review using our clinic review tool, if applicable. No additional management support is needed unless otherwise documented below in the visit note. 

## 2015-11-11 LAB — TSH: TSH: 1.64 m[IU]/L (ref 0.40–4.50)

## 2015-11-11 LAB — BASIC METABOLIC PANEL
BUN: 14 mg/dL (ref 7–25)
CALCIUM: 9.4 mg/dL (ref 8.6–10.3)
CHLORIDE: 103 mmol/L (ref 98–110)
CO2: 28 mmol/L (ref 20–31)
CREATININE: 0.85 mg/dL (ref 0.60–1.35)
Glucose, Bld: 90 mg/dL (ref 65–99)
Potassium: 4.3 mmol/L (ref 3.5–5.3)
Sodium: 139 mmol/L (ref 135–146)

## 2015-11-11 LAB — CBC WITH DIFFERENTIAL/PLATELET
Basophils Absolute: 0.1 10*3/uL (ref 0.0–0.1)
Basophils Relative: 1 % (ref 0–1)
EOS ABS: 0.4 10*3/uL (ref 0.0–0.7)
EOS PCT: 7 % — AB (ref 0–5)
HEMATOCRIT: 44.6 % (ref 39.0–52.0)
HEMOGLOBIN: 14.9 g/dL (ref 13.0–17.0)
LYMPHS PCT: 38 % (ref 12–46)
Lymphs Abs: 2.2 10*3/uL (ref 0.7–4.0)
MCH: 29.4 pg (ref 26.0–34.0)
MCHC: 33.4 g/dL (ref 30.0–36.0)
MCV: 88.1 fL (ref 78.0–100.0)
MONO ABS: 0.6 10*3/uL (ref 0.1–1.0)
MPV: 12.2 fL (ref 8.6–12.4)
Monocytes Relative: 10 % (ref 3–12)
Neutro Abs: 2.6 10*3/uL (ref 1.7–7.7)
Neutrophils Relative %: 44 % (ref 43–77)
Platelets: 205 10*3/uL (ref 150–400)
RBC: 5.06 MIL/uL (ref 4.22–5.81)
RDW: 13.8 % (ref 11.5–15.5)
WBC: 5.8 10*3/uL (ref 4.0–10.5)

## 2015-11-11 LAB — HEPATIC FUNCTION PANEL
ALK PHOS: 33 U/L — AB (ref 40–115)
ALT: 16 U/L (ref 9–46)
AST: 20 U/L (ref 10–40)
Albumin: 4.5 g/dL (ref 3.6–5.1)
BILIRUBIN INDIRECT: 0.3 mg/dL (ref 0.2–1.2)
Bilirubin, Direct: 0.1 mg/dL (ref <=0.2)
TOTAL PROTEIN: 7.2 g/dL (ref 6.1–8.1)
Total Bilirubin: 0.4 mg/dL (ref 0.2–1.2)

## 2015-11-11 LAB — LIPID PANEL
CHOL/HDL RATIO: 6 ratio — AB (ref <=5.0)
Cholesterol: 191 mg/dL (ref 125–200)
HDL: 32 mg/dL — AB (ref >=40)
LDL Cholesterol: 136 mg/dL — ABNORMAL HIGH (ref <130)
Triglycerides: 113 mg/dL (ref <150)
VLDL: 23 mg/dL (ref <30)

## 2015-11-13 ENCOUNTER — Telehealth: Payer: Self-pay | Admitting: Family Medicine

## 2015-11-13 DIAGNOSIS — H8110 Benign paroxysmal vertigo, unspecified ear: Secondary | ICD-10-CM

## 2015-11-13 NOTE — Telephone Encounter (Signed)
Per last note pt is to follow up with vestibular rehabilitation. Would this be an ENT referral?

## 2015-11-13 NOTE — Telephone Encounter (Signed)
No.  He needs to see PT who does vestibular exercises for BPPV

## 2015-11-13 NOTE — Telephone Encounter (Signed)
Referral faxed

## 2015-11-13 NOTE — Telephone Encounter (Signed)
Pt is still having dizziness and would like to see a specialist for treatment. He was not sure of who he would need to see but none of his sx have resolved.

## 2015-11-13 NOTE — Telephone Encounter (Signed)
Pt following up on referral request.  Pt has found a office to see him in the morning.: [facility]. If we could send referral there today, and they could see him in the morning.  Fax: [phone] Phone :  [phone]  pls call if he can get the referral by end of today.

## 2015-11-14 ENCOUNTER — Ambulatory Visit: Payer: No Typology Code available for payment source | Attending: Family Medicine | Admitting: Physical Therapy

## 2015-11-14 ENCOUNTER — Other Ambulatory Visit: Payer: Self-pay | Admitting: Family Medicine

## 2015-11-14 ENCOUNTER — Encounter: Payer: Self-pay | Admitting: Physical Therapy

## 2015-11-14 DIAGNOSIS — R2681 Unsteadiness on feet: Secondary | ICD-10-CM | POA: Diagnosis present

## 2015-11-14 DIAGNOSIS — R42 Dizziness and giddiness: Secondary | ICD-10-CM | POA: Insufficient documentation

## 2015-11-14 DIAGNOSIS — H8110 Benign paroxysmal vertigo, unspecified ear: Secondary | ICD-10-CM

## 2015-11-14 NOTE — Patient Instructions (Addendum)
Gaze Stabilization: Tip Card  1.Target must remain in focus, not blurry, and appear stationary while head is in motion. 2.Perform exercises with small head movements (45 to either side of midline). 3.Increase speed of head motion so long as target is in focus. 4.If you wear eyeglasses, be sure you can see target through lens (therapist will give specific instructions for bifocal / progressive lenses). 5.These exercises may provoke dizziness or nausea. Work through these symptoms. If too dizzy, slow head movement slightly. Rest between each exercise. 6.Exercises demand concentration; avoid distractions. 7.For safety, perform standing exercises close to a counter, wall, corner, or next to someone.  Copyright  VHI. All rights reserved.  Gaze Stabilization: Standing Feet Apart    Feet shoulder width apart, keeping eyes on target on wall _6-8___ feet away, tilt head down 15-30 and move head side to side for _60___ seconds. Repeat while moving head up and down for _60___ seconds. Do _3-5___ sessions per day. Repeat using target on pattern background.  Copyright  VHI. All rights reserved.   

## 2015-11-14 NOTE — Therapy (Signed)
[facility] [address] [place], [place], [address] Phone: [phone]   Fax:  [phone]  Physical Therapy Evaluation  Patient Details  Name: Gregory Sparks MRN: [id number] Date of Birth: 1981/06/17 Referring Provider: Dr. Evelena Peat   Encounter Date: 11/14/2015      PT End of Session - 11/14/15 1059    Visit Number 1   Number of Visits 4   Date for PT Re-Evaluation 12/15/15   Authorization Type [employer]   PT Start Time 0930   PT Stop Time 1021   PT Time Calculation (min) 51 min      Past Medical History  Diagnosis Date  . GERD (gastroesophageal reflux disease)     History reviewed. No pertinent past surgical history.  There were no vitals filed for this visit.  Visit Diagnosis:  Dizziness and giddiness - Plan: PT plan of care cert/re-cert  Unsteadiness on feet - Plan: PT plan of care cert/re-cert      Subjective Assessment - 11/14/15 1042    Subjective Pt reports vertigo started last Sat., 11-04-15 around mid am - had nausea and vomiting and was unable to function during the day "I stayed in the chair all day"; pt states he was able to return to work on [employer] worked [employer]. but says Friday and the weekend were not very good days; says he feels better today: than he has the past few days:  rates vertigo intensity 4-5/10; states he has not driven since last Friday   Patient is accompained by: Family member  wife [name]   Pertinent History allergies, sinus problems; has hearing loss in L ear due to being in [employer]; has history of some tinnitus in L ear   Patient Stated Goals resolve the vertigo   Currently in Pain? No/denies            [facility] PT Assessment - 11/14/15 1047    Assessment   Medical Diagnosis Vertigo   Referring Provider Dr. Evelena Peat    Onset Date/Surgical Date 11/04/15   Precautions   Precautions Other (comment)  Vertigo   Balance Screen   Has the patient fallen in the past 6  months No   Has the patient had a decrease in activity level because of a fear of falling?  No   Is the patient reluctant to leave their home because of a fear of falling?  No   Prior Function   Level of Independence Independent   Neurosurgeon - works with spread sheets            Vestibular Assessment - 11/14/15 0001    Symptom Behavior   Type of Dizziness Imbalance   Frequency of Dizziness mostly constant but varies in intensity   Aggravating Factors Activity in general   Relieving Factors Lying supine;Head stationary   Occulomotor Exam   Occulomotor Alignment Normal   Visual Acuity   Static line 11   Dynamic line 4   Positional Testing   Sidelying Test Sidelying Right;Sidelying Left   Sidelying Right   Sidelying Right Duration no increased vertigo   Sidelying Left   Sidelying Left Duration no increased vertigo       Self Care; instructed in gaze stabilization exercise in standing - horizontal and vertical for HEP                PT Education - 11/14/15 1057    Education provided Yes   Education Details x1 viewing in standing;  Balance exercises - standing feet together and in partial tandem stance with EO and EC with head turns; walking with head turns in the home   Person(s) Educated Patient   Methods Explanation;Demonstration;Handout   Comprehension Verbalized understanding;Returned demonstration             PT Long Term Goals - 11/14/15 1701    PT LONG TERM GOAL #1   Title Pt will have </= 2 line difference with DVA to indicate improvement in gaze stabilization.  12-15-15   Baseline 7 line difference   Period Weeks   Status New   PT LONG TERM GOAL #2   Title Pt will report at least 50% improvement in vertigo.  12-15-15   Time 4   Period Weeks   Status New   PT LONG TERM GOAL #3   Title Complete SOT and establish goal as appropriate.  12-15-15   Time 4   Period Weeks   Status New   PT LONG TERM GOAL #4   Title  Independent in HEP for vestibular exercises.  12-15-15   Time 4   Period Weeks   Status New               Plan - 11/14/15 1655    Clinical Impression Statement Pt is a 35 year old male with spontaneous onset of vertigo starting on 11-04-15; pt does not have symptoms consistent with BPPV but more consistent with vestibular neuritis with significantly decreased gaze stabilization with a 7 line difference in DVA.  Pt reports feeling off balance and reports dizziness with head movement.                                                                                                                                                                                                                  Pt will benefit from skilled therapeutic intervention in order to improve on the following deficits Dizziness;Decreased balance   Rehab Potential Good   PT Frequency 1x / week   PT Duration 4 weeks   PT Treatment/Interventions Vestibular;Neuromuscular re-education;ADLs/Self Care Home Management;Therapeutic exercise;Therapeutic activities;Gait training;Patient/family education   PT Next Visit Plan do SOT and check HEP   PT Home Exercise Plan x1 viewing   Consulted and Agree with Plan of Care Patient         Problem List Patient Active Problem List   Diagnosis Date Noted  . Tensor fascia lata syndrome 02/24/2013  . GERD (gastroesophageal reflux disease) 03/30/2012  . H/O cold sores 03/30/2012  Kary Kos, PT 11/14/2015, 5:09 PM  [place] [facility] [address] [place], [place], [address] Phone: [phone]   Fax:  [phone]  Name: Gregory Sparks MRN: [id number] Date of Birth: 1980/12/17

## 2015-11-21 ENCOUNTER — Ambulatory Visit: Payer: No Typology Code available for payment source | Admitting: Family Medicine

## 2015-11-23 ENCOUNTER — Ambulatory Visit: Payer: No Typology Code available for payment source | Admitting: Physical Therapy

## 2015-11-23 DIAGNOSIS — R42 Dizziness and giddiness: Secondary | ICD-10-CM | POA: Diagnosis not present

## 2015-11-23 DIAGNOSIS — R2681 Unsteadiness on feet: Secondary | ICD-10-CM

## 2015-11-24 NOTE — Patient Instructions (Signed)
Balance: Eyes Open - Bilateral (Varied Surfaces)    Stand, feet shoulder width, eyes open. Maintain balance _60___ seconds. Repeat __2__ times per set. Do _1___ sets per session. Do __5_ sessions per week. Repeat on compliant surface: foam.  Copyright  VHI. All rights reserved.  Balance: Eyes Closed - Bilateral (Varied Surfaces)    Stand, feet shoulder width, close eyes. Maintain balance _60___ seconds. Repeat __2_ times per set. Do ___1_ sets per session. Do _5___ sessions per week. Repeat on compliant surface: foam.  Copyright  VHI. All rights reserved.

## 2015-11-24 NOTE — Therapy (Signed)
[facility] [address] [place], [place], [address] Phone: [phone]   Fax:  [phone]  Physical Therapy Treatment  Patient Details  Name: Gregory Sparks MRN: [id number] Date of Birth: 1981/08/31 Referring Provider: Dr. Evelena Peat   Encounter Date: 11/23/2015      PT End of Session - 11/24/15 1112    Visit Number 2   Number of Visits 4   Date for PT Re-Evaluation 12/15/15   Authorization Type UHC   PT Start Time 0813   PT Stop Time 0852   PT Time Calculation (min) 39 min      Past Medical History  Diagnosis Date  . GERD (gastroesophageal reflux disease)     No past surgical history on file.  There were no vitals filed for this visit.  Visit Diagnosis:  Dizziness and giddiness  Unsteadiness on feet      Subjective Assessment - 11/24/15 1106    Subjective Pt states he has no dizziness at this time - now it's "just my balance that's the problem"   Patient is accompained by: Family member   Pertinent History allergies, sinus problems; has hearing loss in L ear due to being in [employer]; has history of some tinnitus in L ear   Patient Stated Goals resolve the vertigo   Currently in Pain? No/denies           Sensory Organization Test score 73/100 for composite (N= 70/100);  Somatosensory slightly decr. At 84 with N = 90/100;  Normal visual and vestibular inputs  Condition 1;  All 3 trials WNL's Condition 2:  Trial 1 and 3 slightly decr. ; trial 2 WNL Condition 3:  Trials 1 and 2 slightly decr. ; trial 3 WNL Condition 4:  All 3 trials WNL Condition 5:  1 and 2 slightly decr. With 3 WNL Condition 6:  All 3 trials WNL    Dynamic Visual Acuity test - 5 line difference with pt reading line 6;  SVA line 11 (improved from initial 7 line difference)     Pt performed balance on foam exercises - EO and EC - feet apart and feet together - instructed in these for HEP Pt also performed SLS RLE and  LLE on foam for HEP - 10 sec hold with UE support prn            Balance Exercises - 11/24/15 1109    Balance Exercises: Standing   Standing Eyes Opened Narrow base of support (BOS);Wide (BOA);Head turns;Foam/compliant surface   Standing Eyes Closed Narrow base of support (BOS);Wide (BOA);Head turns;Foam/compliant surface   SLS Eyes open;Foam/compliant surface;Intermittent upper extremity support;1 rep           PT Education - 11/24/15 1111    Education provided Yes   Education Details added balance on foam exercises - 4 positions   Person(s) Educated Patient;Spouse   Methods Explanation;Demonstration;Handout   Comprehension Verbalized understanding;Returned demonstration             PT Long Term Goals - 11/14/15 1701    PT LONG TERM GOAL #1   Title Pt will have </= 2 line difference with DVA to indicate improvement in gaze stabilization.  12-15-15   Baseline 7 line difference   Period Weeks   Status New   PT LONG TERM GOAL #2   Title Pt will report at least 50% improvement in vertigo.  12-15-15   Time 4   Period Weeks   Status New   PT  LONG TERM GOAL #3   Title Complete SOT and establish goal as appropriate.  12-15-15   Time 4   Period Weeks   Status New   PT LONG TERM GOAL #4   Title Independent in HEP for vestibular exercises.  12-15-15   Time 4   Period Weeks   Status New               Plan - 11/24/15 1112    Clinical Impression Statement Pt improving with VOR function with 5 line difference with DVA rather than a 7 line difference as at time of initial evaluation; minimal deficits per SOT score   Pt will benefit from skilled therapeutic intervention in order to improve on the following deficits Dizziness;Decreased balance   Rehab Potential Good   PT Frequency 1x / week   PT Duration 4 weeks   PT Treatment/Interventions Vestibular;Neuromuscular re-education;ADLs/Self Care Home Management;Therapeutic exercise;Therapeutic activities;Gait  training;Patient/family education   PT Next Visit Plan redo SOT; check LTG's - D/C?   PT Home Exercise Plan x1 viewing; added balance on foam on 11-23-15   Consulted and Agree with Plan of Care Patient;Family member/caregiver   Family Member Consulted wife Megan        Problem List Patient Active Problem List   Diagnosis Date Noted  . Tensor fascia lata syndrome 02/24/2013  . GERD (gastroesophageal reflux disease) 03/30/2012  . H/O cold sores 03/30/2012    Kary KosDilday, Braxston Quinter Suzanne, PT 11/24/2015, 11:15 AM  [facility] [address] [place], [place], [address] Phone: [phone]   Fax:  [phone]  Name: Gregory Sparks MRN: [id number] Date of Birth: 04/21/1981

## 2015-12-11 ENCOUNTER — Other Ambulatory Visit: Payer: Self-pay | Admitting: Allergy and Immunology

## 2015-12-12 ENCOUNTER — Other Ambulatory Visit: Payer: Self-pay | Admitting: Allergy

## 2015-12-12 MED ORDER — CETIRIZINE HCL 10 MG PO TABS: 10.0000 mg | ORAL_TABLET | Freq: Every day | ORAL | Status: DC

## 2015-12-18 ENCOUNTER — Ambulatory Visit: Payer: No Typology Code available for payment source | Admitting: Physical Therapy

## 2016-01-02 ENCOUNTER — Ambulatory Visit: Payer: No Typology Code available for payment source | Admitting: Physical Therapy

## 2016-01-09 ENCOUNTER — Ambulatory Visit: Payer: No Typology Code available for payment source | Attending: Family Medicine | Admitting: Physical Therapy

## 2016-01-09 DIAGNOSIS — R42 Dizziness and giddiness: Secondary | ICD-10-CM | POA: Diagnosis present

## 2016-01-14 NOTE — Therapy (Signed)
[place] [address] [place], [place], [address] Phone: [phone]   Fax:  [phone]  Physical Therapy Treatment  Patient Details  Name: Gregory Sparks MRN: [id number] Date of Birth: 07/03/1981 Referring Provider: Dr. Carolann Littler   Encounter Date: 01/09/2016      PT End of Session - 01/14/16 1916    Visit Number 3   Number of Visits 4   Date for PT Re-Evaluation 12/15/15   Authorization Type UHC   PT Start Time 0802   PT Stop Time 0846   PT Time Calculation (min) 44 min      Past Medical History  Diagnosis Date  . GERD (gastroesophageal reflux disease)     No past surgical history on file.  There were no vitals filed for this visit.      Subjective Assessment - 01/14/16 1853    Subjective Pt states he is so much better - only a little bit of dizziness when he turns his head quickly but not much at this time   Patient is accompained by: Family member   Pertinent History allergies, sinus problems; has hearing loss in L ear due to being in [employer]; has history of some tinnitus in L ear   Patient Stated Goals resolve the vertigo   Currently in Pain? No/denies      Sensory Organization Test - score 82/100 composite (WNL's)  Somatosensory, visual and vestibular inputs WNL's  Dynamic visual acuity test - 3 line difference from static visual acuity  Self Care - discussed LTG's and progress towards goals - reviewed HEP with recommendation to cont. With x1 viewing exercise for additional 3-4 weeks                                PT Long Term Goals - 01/14/16 1916    PT LONG TERM GOAL #1   Title Pt will have </= 2 line difference with DVA to indicate improvement in gaze stabilization.  12-15-15   Baseline 3 line difference - 01-09-16   Status Partially Met   PT LONG TERM GOAL #2   Title Pt will report at least 50% improvement in vertigo.  12-15-15   Status Achieved   PT LONG  TERM GOAL #3   Title Complete SOT and establish goal as appropriate.  12-15-15   Status Achieved   PT LONG TERM GOAL #4   Title Independent in HEP for vestibular exercises.  12-15-15   Status Achieved             Patient will benefit from skilled therapeutic intervention in order to improve the following deficits and impairments:     Visit Diagnosis: Dizziness and giddiness     Problem List Patient Active Problem List   Diagnosis Date Noted  . Tensor fascia lata syndrome 02/24/2013  . GERD (gastroesophageal reflux disease) 03/30/2012  . H/O cold sores 03/30/2012    PHYSICAL THERAPY DISCHARGE SUMMARY  Visits from Start of Care: 3  Current functional level related to goals / functional outcomes: See above for progress towards goals   Remaining deficits: Slightly abnormal gaze stabilization with a 3 line difference in dynamic visual acuity test   Education / Equipment: Pt has been instructed in a HEP for vestibular exercises and gaze stabilization exercise.  Plan: Patient agrees to discharge.  Patient goals were partially met. Patient is being discharged due to meeting the stated rehab goals.  ?????  Alda Lea, PT 01/14/2016, 7:20 PM  [place] [address] [place], [place], [address] Phone: [phone]   Fax:  [phone]  Name: Gregory Sparks MRN: [id number] Date of Birth: 02-Nov-1980

## 2016-08-12 ENCOUNTER — Encounter: Payer: Self-pay | Admitting: Family Medicine

## 2016-08-12 ENCOUNTER — Ambulatory Visit (INDEPENDENT_AMBULATORY_CARE_PROVIDER_SITE_OTHER): Payer: No Typology Code available for payment source | Admitting: Family Medicine

## 2016-08-12 ENCOUNTER — Other Ambulatory Visit: Payer: Self-pay | Admitting: Allergy

## 2016-08-12 VITALS — BP 90/60 | HR 66 | Temp 98.2°F | Ht 69.0 in | Wt 198.0 lb

## 2016-08-12 DIAGNOSIS — M545 Low back pain, unspecified: Secondary | ICD-10-CM

## 2016-08-12 MED ORDER — METHOCARBAMOL 500 MG PO TABS: 500.0000 mg | ORAL_TABLET | Freq: Three times a day (TID) | ORAL | 0 refills | Status: DC | PRN

## 2016-08-12 NOTE — Patient Instructions (Signed)
Low Back Sprain Rehab  Ask your health care provider which exercises are safe for you. Do exercises exactly as told by your health care provider and adjust them as directed. It is normal to feel mild stretching, pulling, tightness, or discomfort as you do these exercises, but you should stop right away if you feel sudden pain or your pain gets worse. Do not begin these exercises until told by your health care provider.  Stretching and range of motion exercises  These exercises warm up your muscles and joints and improve the movement and flexibility of your back. These exercises also help to relieve pain, numbness, and tingling.  Exercise A: Lumbar rotation     1. Lie on your back on a firm surface and bend your knees.  2. Straighten your arms out to your sides so each arm forms an "L" shape with a side of your body (a 90 degree angle).  3. Slowly move both of your knees to one side of your body until you feel a stretch in your lower back. Try not to let your shoulders move off of the floor.  4. Hold for __________ seconds.  5. Tense your abdominal muscles and slowly move your knees back to the starting position.  6. Repeat this exercise on the other side of your body.  Repeat __________ times. Complete this exercise __________ times a day.  Exercise B: Prone extension on elbows     1. Lie on your abdomen on a firm surface.  2. Prop yourself up on your elbows.  3. Use your arms to help lift your chest up until you feel a gentle stretch in your abdomen and your lower back.  ? This will place some of your body weight on your elbows. If this is uncomfortable, try stacking pillows under your chest.  ? Your hips should stay down, against the surface that you are lying on. Keep your hip and back muscles relaxed.  4. Hold for __________ seconds.  5. Slowly relax your upper body and return to the starting position.  Repeat __________ times. Complete this exercise __________ times a day.  Strengthening exercises  These  exercises build strength and endurance in your back. Endurance is the ability to use your muscles for a long time, even after they get tired.  Exercise C: Pelvic tilt   1. Lie on your back on a firm surface. Bend your knees and keep your feet flat.  2. Tense your abdominal muscles. Tip your pelvis up toward the ceiling and flatten your lower back into the floor.  ? To help with this exercise, you may place a small towel under your lower back and try to push your back into the towel.  3. Hold for __________ seconds.  4. Let your muscles relax completely before you repeat this exercise.  Repeat __________ times. Complete this exercise __________ times a day.  Exercise D: Alternating arm and leg raises     1. Get on your hands and knees on a firm surface. If you are on a hard floor, you may want to use padding to cushion your knees, such as an exercise mat.  2. Line up your arms and legs. Your hands should be below your shoulders, and your knees should be below your hips.  3. Lift your left leg behind you. At the same time, raise your right arm and straighten it in front of you.  ? Do not lift your leg higher than your hip.  ? Do   not lift your arm higher than your shoulder.  ? Keep your abdominal and back muscles tight.  ? Keep your hips facing the ground.  ? Do not arch your back.  ? Keep your balance carefully, and do not hold your breath.  4. Hold for __________ seconds.  5. Slowly return to the starting position and repeat with your right leg and your left arm.  Repeat __________ times. Complete this exercise __________ times a day.  Exercise E: Abdominal set with straight leg raise     1. Lie on your back on a firm surface.  2. Bend one of your knees and keep your other leg straight.  3. Tense your abdominal muscles and lift your straight leg up, 4-6 inches (10-15 cm) off the ground.  4. Keep your abdominal muscles tight and hold for __________ seconds.  ? Do not hold your breath.  ? Do not arch your back. Keep it  flat against the ground.  5. Keep your abdominal muscles tense as you slowly lower your leg back to the starting position.  6. Repeat with your other leg.  Repeat __________ times. Complete this exercise __________ times a day.  Posture and body mechanics     Body mechanics refers to the movements and positions of your body while you do your daily activities. Posture is part of body mechanics. Good posture and healthy body mechanics can help to relieve stress in your body's tissues and joints. Good posture means that your spine is in its natural S-curve position (your spine is neutral), your shoulders are pulled back slightly, and your head is not tipped forward. The following are general guidelines for applying improved posture and body mechanics to your everyday activities.  Standing     · When standing, keep your spine neutral and your feet about hip-width apart. Keep a slight bend in your knees. Your ears, shoulders, and hips should line up.  · When you do a task in which you stand in one place for a long time, place one foot up on a stable object that is 2-4 inches (5-10 cm) high, such as a footstool. This helps keep your spine neutral.  Sitting     · When sitting, keep your spine neutral and keep your feet flat on the floor. Use a footrest, if necessary, and keep your thighs parallel to the floor. Avoid rounding your shoulders, and avoid tilting your head forward.  · When working at a desk or a computer, keep your desk at a height where your hands are slightly lower than your elbows. Slide your chair under your desk so you are close enough to maintain good posture.  · When working at a computer, place your monitor at a height where you are looking straight ahead and you do not have to tilt your head forward or downward to look at the screen.  Resting     · When lying down and resting, avoid positions that are most painful for you.  · If you have pain with activities such as sitting, bending, stooping, or  squatting (flexion-based activities), lie in a position in which your body does not bend very much. For example, avoid curling up on your side with your arms and knees near your chest (fetal position).  · If you have pain with activities such as standing for a long time or reaching with your arms (extension-based activities), lie with your spine in a neutral position and bend your knees slightly. Try the following   positions:  · Lying on your side with a pillow between your knees.  · Lying on your back with a pillow under your knees.  Lifting     · When lifting objects, keep your feet at least shoulder-width apart and tighten your abdominal muscles.  · Bend your knees and hips and keep your spine neutral. It is important to lift using the strength of your legs, not your back. Do not lock your knees straight out.  · Always ask for help to lift heavy or awkward objects.  This information is not intended to replace advice given to you by your health care provider. Make sure you discuss any questions you have with your health care provider.  Document Released: 08/19/2005 Document Revised: 04/25/2016 Document Reviewed: 05/31/2015  Elsevier Interactive Patient Education © 2017 [employer] Inc[employer]

## 2016-08-12 NOTE — Progress Notes (Signed)
Subjective:     Patient ID: Gregory Sparks, male   DOB: 07/24/1981, 35 y.o.   MRN: [id number]  HPI Patient seen with one-week history of low back pain. His pain somewhat midline but radiates out bilaterally. He started playing some golf recently and wonders if this is related to golf swing but denies any specific injury. He noticed one day after getting up he felt like his back was "crooked ". He feels that he may have some back spasm. Pain is constant and achy to sharp quality. Worse with back flexion. He's tried some Aleve, topical heat, and topical mentholated cream without much improvement. No urine or stool incontinence. Denies lower extremity weakness or numbness. No prior history of back difficulties.  Past Medical History:  Diagnosis Date  . GERD (gastroesophageal reflux disease)    No past surgical history on file.  reports that he has never smoked. He does not have any smokeless tobacco history on file. His alcohol and drug histories are not on file. family history includes Alcohol abuse in his father, maternal grandmother, and mother; Hyperlipidemia in his father and mother; Hypertension in his father, maternal grandmother, and mother. No Known Allergies   Review of Systems  Constitutional: Negative for activity change, appetite change and fever.  Respiratory: Negative for cough and shortness of breath.   Cardiovascular: Negative for chest pain and leg swelling.  Gastrointestinal: Negative for abdominal pain and vomiting.  Genitourinary: Negative for dysuria, flank pain and hematuria.  Musculoskeletal: Positive for back pain. Negative for joint swelling.  Neurological: Negative for weakness and numbness.       Objective:   Physical Exam  Constitutional: He is oriented to person, place, and time. He appears well-developed and well-nourished. No distress.  Neck: No thyromegaly present.  Cardiovascular: Normal rate, regular rhythm and normal heart sounds.   No murmur  heard. Pulmonary/Chest: Effort normal and breath sounds normal. No respiratory distress. He has no wheezes. He has no rales.  Musculoskeletal: He exhibits no edema.  Straight leg raise are negative bilaterally  Neurological: He is alert and oriented to person, place, and time. He has normal reflexes. No cranial nerve deficit.  Full-strength lower extremities and symmetric reflexes ankle and knee  Skin: No rash noted.       Assessment:     Low back pain. Suspect musculoskeletal strain. Nonfocal exam neurologically    Plan:     -Discussed back stretches with handout given -Continue heat and/or ice for symptomatic relief -Trial of Robaxin 500 mg 1-2 daily at bedtime when necessary -Continue over-the-counter Aleve -Consider physical therapy trial if not improving over the next couple of weeks  Kristian CoveyBruce W Pesach Frisch MD [facility]

## 2016-10-01 ENCOUNTER — Other Ambulatory Visit: Payer: Self-pay | Admitting: Family Medicine

## 2016-10-01 ENCOUNTER — Other Ambulatory Visit: Payer: Self-pay

## 2016-10-08 ENCOUNTER — Ambulatory Visit (INDEPENDENT_AMBULATORY_CARE_PROVIDER_SITE_OTHER): Payer: No Typology Code available for payment source | Admitting: Allergy and Immunology

## 2016-10-08 ENCOUNTER — Encounter: Payer: Self-pay | Admitting: Allergy and Immunology

## 2016-10-08 VITALS — BP 120/78 | HR 64 | Resp 16 | Ht 69.0 in | Wt 200.6 lb

## 2016-10-08 DIAGNOSIS — J3089 Other allergic rhinitis: Secondary | ICD-10-CM | POA: Diagnosis not present

## 2016-10-08 MED ORDER — FLUTICASONE PROPIONATE 50 MCG/ACT NA SUSP: 1.0000 | Freq: Two times a day (BID) | NASAL | 5 refills | Status: DC

## 2016-10-08 MED ORDER — CETIRIZINE HCL 10 MG PO TABS: 10.0000 mg | ORAL_TABLET | Freq: Every day | ORAL | 5 refills | Status: DC

## 2016-10-08 NOTE — Assessment & Plan Note (Deleted)
   Continue appropriate allergen avoidance measures, fluticasone nasal spray as needed, and cetirizine 10 mg daily as needed.  I have also recommended nasal saline spray (i.e., Simply Saline) or nasal saline lavage (i.e., NeilMed) as needed prior to medicated nasal sprays.  Refill prescriptions have been provided for fluticasone nasal spray and cetirizine. 

## 2016-10-08 NOTE — Assessment & Plan Note (Signed)
   Continue appropriate allergen avoidance measures, fluticasone nasal spray as needed, and cetirizine 10 mg daily as needed.  I have also recommended nasal saline spray (i.e., Simply Saline) or nasal saline lavage (i.e., NeilMed) as needed prior to medicated nasal sprays.  Refill prescriptions have been provided for fluticasone nasal spray and cetirizine.

## 2016-10-08 NOTE — Patient Instructions (Signed)
Allergic rhinitis  Continue appropriate allergen avoidance measures, fluticasone nasal spray as needed, and cetirizine 10 mg daily as needed.  I have also recommended nasal saline spray (i.e., Simply Saline) or nasal saline lavage (i.e., NeilMed) as needed prior to medicated nasal sprays.  Refill prescriptions have been provided for fluticasone nasal spray and cetirizine.   Return in about 1 year (around 10/08/2017), or if symptoms worsen or fail to improve.

## 2016-10-08 NOTE — Progress Notes (Signed)
    Follow-up Note  RE: Gregory Sparks [place] MRN: [id number] DOB: 07/24/1981 Date of Office Visit: 10/08/2016  Primary care provider: Kristian CoveyBURCHETTE,BRUCE W, MD Referring provider: Kristian CoveyBurchette, Bruce W, MD  History of present illness: Gregory Sparks Po[place]ra is a 36 y.o. male with perennial allergic rhinitis presents today for follow up.  He was last seen in this clinic in December 2015.  He reports that his nasal allergy symptoms have been well-controlled with fluticasone nasal spray as needed and/or cetirizine as needed.  He has no nasal symptom complaints.  He needs refills on his medications today.   Assessment and plan: Allergic rhinitis  Continue appropriate allergen avoidance measures, fluticasone nasal spray as needed, and cetirizine 10 mg daily as needed.  I have also recommended nasal saline spray (i.e., Simply Saline) or nasal saline lavage (i.e., NeilMed) as needed prior to medicated nasal sprays.  Refill prescriptions have been provided for fluticasone nasal spray and cetirizine.   Meds ordered this encounter  Medications  . fluticasone (FLONASE) 50 MCG/ACT nasal spray    Sig: Place 1 spray into both nostrils 2 (two) times daily.    Dispense:  16 g    Refill:  5  . cetirizine (ZYRTEC) 10 MG tablet    Sig: Take 1 tablet (10 mg total) by mouth daily.    Dispense:  30 tablet    Refill:  5    Physical examination: Blood pressure 120/78, pulse 64, resp. rate 16, height 5\' 9"  (1.753 m), weight 200 lb 9.6 oz (91 kg).  General: Alert, interactive, in no acute distress. HEENT: TMs pearly gray, turbinates minimally edematous without discharge, post-pharynx unremarkable. Neck: Supple without lymphadenopathy. Lungs: Clear to auscultation without wheezing, rhonchi or rales. CV: Normal S1, S2 without murmurs. Skin: Warm and dry, without lesions or rashes.  The following portions of the patient's history were reviewed and updated as appropriate: allergies, current medications, past family history, past  medical history, past social history, past surgical history and problem list.  Allergies as of 10/08/2016   No Known Allergies     Medication List       Accurate as of 10/08/16  7:28 PM. Always use your most recent med list.          cetirizine 10 MG tablet Commonly known as:  ZYRTEC Take 1 tablet (10 mg total) by mouth daily.   fluticasone 50 MCG/ACT nasal spray Commonly known as:  FLONASE Place 1 spray into both nostrils 2 (two) times daily.   valACYclovir 1000 MG tablet Commonly known as:  VALTREX USE AS DIRECTED IF NEEDED FOR COLD SORES       No Known Allergies  I appreciate the opportunity to take part in Brant's care. Please do not hesitate to contact me with questions.  Sincerely,   R. Jorene Guestarter Saanvika Vazques, MD

## 2017-07-17 ENCOUNTER — Other Ambulatory Visit: Payer: Self-pay | Admitting: Family Medicine

## 2017-09-21 ENCOUNTER — Other Ambulatory Visit: Payer: Self-pay | Admitting: Family Medicine

## 2018-02-23 ENCOUNTER — Ambulatory Visit: Payer: No Typology Code available for payment source | Admitting: Family Medicine

## 2018-02-23 ENCOUNTER — Encounter: Payer: Self-pay | Admitting: Family Medicine

## 2018-02-23 VITALS — BP 110/80 | HR 68 | Temp 98.5°F | Wt 190.2 lb

## 2018-02-23 DIAGNOSIS — H8112 Benign paroxysmal vertigo, left ear: Secondary | ICD-10-CM

## 2018-02-23 NOTE — Patient Instructions (Signed)
Vertigo °Vertigo is the feeling that you or your surroundings are moving when they are not. Vertigo can be dangerous if it occurs while you are doing something that could endanger you or others, such as driving. °What are the causes? °This condition is caused by a disturbance in the signals that are sent by your body’s sensory systems to your brain. Different causes of a disturbance can lead to vertigo, including: °· Infections, especially in the inner ear. °· A bad reaction to a drug, or misuse of alcohol and medicines. °· Withdrawal from drugs or alcohol. °· Quickly changing positions, as when lying down or rolling over in bed. °· Migraine headaches. °· Decreased blood flow to the brain. °· Decreased blood pressure. °· Increased pressure in the brain from a head or neck injury, stroke, infection, tumor, or bleeding. °· Central nervous system disorders. ° °What are the signs or symptoms? °Symptoms of this condition usually occur when you move your head or your eyes in different directions. Symptoms may start suddenly, and they usually last for less than a minute. Symptoms may include: °· Loss of balance and falling. °· Feeling like you are spinning or moving. °· Feeling like your surroundings are spinning or moving. °· Nausea and vomiting. °· Blurred vision or double vision. °· Difficulty hearing. °· Slurred speech. °· Dizziness. °· Involuntary eye movement (nystagmus). ° °Symptoms can be mild and cause only slight annoyance, or they can be severe and interfere with daily life. Episodes of vertigo may return (recur) over time, and they are often triggered by certain movements. Symptoms may improve over time. °How is this diagnosed? °This condition may be diagnosed based on medical history and the quality of your nystagmus. Your health care provider may test your eye movements by asking you to quickly change positions to trigger the nystagmus. This may be called the Dix-Hallpike test, head thrust test, or roll test.  You may be referred to a health care provider who specializes in ear, nose, and throat (ENT) problems (otolaryngologist) or a provider who specializes in disorders of the central nervous system (neurologist). °You may have additional testing, including: °· A physical exam. °· Blood tests. °· MRI. °· A CT scan. °· An electrocardiogram (ECG). This records electrical activity in your heart. °· An electroencephalogram (EEG). This records electrical activity in your brain. °· Hearing tests. ° °How is this treated? °Treatment for this condition depends on the cause and the severity of the symptoms. Treatment options include: °· Medicines to treat nausea or vertigo. These are usually used for severe cases. Some medicines that are used to treat other conditions may also reduce or eliminate vertigo symptoms. These include: °? Medicines that control allergies (antihistamines). °? Medicines that control seizures (anticonvulsants). °? Medicines that relieve depression (antidepressants). °? Medicines that relieve anxiety (sedatives). °· Head movements to adjust your inner ear back to normal. If your vertigo is caused by an ear problem, your health care provider may recommend certain movements to correct the problem. °· Surgery. This is rare. ° °Follow these instructions at home: °Safety °· Move slowly.Avoid sudden body or head movements. °· Avoid driving. °· Avoid operating heavy machinery. °· Avoid doing any tasks that would cause danger to you or others if you would have a vertigo episode during the task. °· If you have trouble walking or keeping your balance, try using a cane for stability. If you feel dizzy or unstable, sit down right away. °· Return to your normal activities as told by your   health care provider. Ask your health care provider what activities are safe for you. °General instructions °· Take over-the-counter and prescription medicines only as told by your health care provider. °· Avoid certain positions or  movements as told by your health care provider. °· Drink enough fluid to keep your urine clear or pale yellow. °· Keep all follow-up visits as told by your health care provider. This is important. °Contact a health care provider if: °· Your medicines do not relieve your vertigo or they make it worse. °· You have a fever. °· Your condition gets worse or you develop new symptoms. °· Your family or friends notice any behavioral changes. °· Your nausea or vomiting gets worse. °· You have numbness or a “pins and needles” sensation in part of your body. °Get help right away if: °· You have difficulty moving or speaking. °· You are always dizzy. °· You faint. °· You develop severe headaches. °· You have weakness in your hands, arms, or legs. °· You have changes in your hearing or vision. °· You develop a stiff neck. °· You develop sensitivity to light. °This information is not intended to replace advice given to you by your health care provider. Make sure you discuss any questions you have with your health care provider. °Document Released: 05/29/2005 Document Revised: 01/31/2016 Document Reviewed: 12/12/2014 °Elsevier Interactive Patient Education © 2018 Elsevier Inc. ° °

## 2018-02-23 NOTE — Progress Notes (Signed)
  Subjective:     Patient ID: Gregory Sparks, male   DOB: 03/22/1981, 37 y.o.   MRN: [id number]  HPI Patient seen with vertigo. Onset this past Friday. He had similar episode couple years ago which eventually improved following vestibular rehabilitation. Still has some symptoms when turning head to left. Symptoms somewhat intermittent. No hearing changes. Rare headache. No consistent ringing in the years. No focal weakness. No speech changes. No swallowing difficulties.  Past Medical History:  Diagnosis Date  . GERD (gastroesophageal reflux disease)    No past surgical history on file.  reports that he has never smoked. He has never used smokeless tobacco. His alcohol and drug histories are not on file. family history includes Alcohol abuse in his father, maternal grandmother, and mother; Hyperlipidemia in his father and mother; Hypertension in his father, maternal grandmother, and mother. No Known Allergies   Review of Systems  Constitutional: Negative for chills and fever.  HENT: Negative for trouble swallowing.   Eyes: Negative for visual disturbance.  Respiratory: Negative for shortness of breath.   Cardiovascular: Negative for chest pain.  Neurological: Positive for dizziness. Negative for seizures, syncope, speech difficulty, weakness and light-headedness.  Psychiatric/Behavioral: Negative for confusion.       Objective:   Physical Exam  Constitutional: He is oriented to person, place, and time. He appears well-developed and well-nourished.  HENT:  Right Ear: External ear normal.  Left Ear: External ear normal.  Eyes: Pupils are equal, round, and reactive to light. EOM are normal.  Cardiovascular: Normal rate and regular rhythm.  Pulmonary/Chest: Effort normal and breath sounds normal.  Musculoskeletal: He exhibits no edema.  Neurological: He is alert and oriented to person, place, and time. No cranial nerve deficit. Coordination normal.       Assessment:     Portable  benign peripheral positional vertigo to the left    Plan:     -Try Epley maneuvers with handout given. If not improving with home exercises next couple days consider vestibular rehabilitation  Kristian CoveyBruce W Burchette MD [facility]

## 2018-02-26 ENCOUNTER — Encounter: Payer: Self-pay | Admitting: Allergy & Immunology

## 2018-02-26 ENCOUNTER — Telehealth: Payer: Self-pay | Admitting: *Deleted

## 2018-02-26 ENCOUNTER — Ambulatory Visit (INDEPENDENT_AMBULATORY_CARE_PROVIDER_SITE_OTHER): Payer: No Typology Code available for payment source | Admitting: Allergy & Immunology

## 2018-02-26 VITALS — BP 110/72 | HR 68 | Resp 16 | Ht 69.0 in | Wt 195.4 lb

## 2018-02-26 DIAGNOSIS — J3089 Other allergic rhinitis: Secondary | ICD-10-CM | POA: Diagnosis not present

## 2018-02-26 DIAGNOSIS — R42 Dizziness and giddiness: Secondary | ICD-10-CM | POA: Diagnosis not present

## 2018-02-26 DIAGNOSIS — H8112 Benign paroxysmal vertigo, left ear: Secondary | ICD-10-CM

## 2018-02-26 MED ORDER — FEXOFENADINE HCL 180 MG PO TABS: 180.0000 mg | ORAL_TABLET | Freq: Every day | ORAL | 5 refills | Status: DC

## 2018-02-26 NOTE — Patient Instructions (Addendum)
1. Perennial allergic rhinitis - Increase Flonase to two sprays per nostril twice daily for one week at least. - Start the prednisone dose pack provided today.  - Call us Monday with an update. - We might refer you to ENT if these symptoms continue.   2. Return in about 1 year (around 02/27/2019), or if symptoms worsen or fail to improve.   Please inform us of any Emergency Department visits, hospitalizations, or changes in symptoms. Call us before going to the ED for breathing or allergy symptoms since we might be able to fit you in for a sick visit. Feel free to contact us anytime with any questions, problems, or concerns.  It was a pleasure to meet you today!  Websites that have reliable patient information: 1. American Academy of Asthma, Allergy, and Immunology: www.aaaai.org 2. Food Allergy Research and Education (FARE): foodallergy.org 3. Mothers of Asthmatics: http://www.asthmacommunitynetwork.org 4. American College of Allergy, Asthma, and Immunology: MissingWeapons.cawww.acaai.org   Make sure you are registered to vote!   3

## 2018-02-26 NOTE — Progress Notes (Signed)
FOLLOW UP  Date of Service/Encounter:  02/26/18   Assessment:   Perennial allergic rhinitis (dust mites only)  Vertigo  Plan/Recommendations:   1. Perennial allergic rhinitis - Increase Flonase to two sprays per nostril twice daily for one week at least. - Start the prednisone dose pack provided today.  - Call us Monday with an update. - We might refer you to ENT if these symptoms continue.  - We will discontinue the Zyrtec and start Allegra one tablet daily instead.   2. Return in about 1 year (around 02/27/2019) or if symptoms worsen or fail to improve.  Subjective:   Gregory Sparks is a 37 y.o. male presenting today for follow up of  Chief Complaint  Patient presents with  . Allergic Rhinitis   . Sinus Problem    ?vertigo on Friday dizziness and balance on left side issue    Gregory Sparks has a history of the following: Patient Active Problem List   Diagnosis Date Noted  . Vertigo 02/26/2018  . Perennial allergic rhinitis 10/08/2016  . Tensor fascia lata syndrome 02/24/2013  . GERD (gastroesophageal reflux disease) 03/30/2012  . H/O cold sores 03/30/2012    History obtained from: chart review and patient.  [facility] Provider is [name], MD.     Gregory Sparks is a 37 y.o. male presenting for a follow up visit.  He was last seen in February 2018 by Dr. Nunzio Cobbs.  At that time, he was continued on Flonase as needed as well as cetirizine 10 mg daily as needed.  Nasal saline rinses were recommended.  Since the last visit, he has mostly done well. He presents today for his annual evaluation and prescription refill. He is on Flonase which he using only three times per month. He is also on cetirizine which causes drowsiness even if he takes it in the evening. He has never tried Production manager. His testing was performed in December 2015 and was positive only to dust mites with intradermals.    In the last week, he has developed problems with vertigo. He  saw his PCP who did not recommend anything in particular. He is wondering whether his current set of symptoms are related to uncontrolled allergies versus something else. He was playing golf when this all started and he notes that it started when he took a particular aggressive swing. When this happened two years ago, he actually went to vestibular rehab for two weeks and he is wondering whether he needs this again. He denies any preceding viral illness. He denies any fevers during this time.   Otherwise, there have been no changes to his past medical history, surgical history, family history, or social history.    Review of Systems: a 14-point review of systems is pertinent for what is mentioned in HPI.  Otherwise, all other systems were negative. Constitutional: negative other than that listed in the HPI Eyes: negative other than that listed in the HPI Ears, nose, mouth, throat, and face: negative other than that listed in the HPI Respiratory: negative other than that listed in the HPI Cardiovascular: negative other than that listed in the HPI Gastrointestinal: negative other than that listed in the HPI Genitourinary: negative other than that listed in the HPI Integument: negative other than that listed in the HPI Hematologic: negative other than that listed in the HPI Musculoskeletal: negative other than that listed in the HPI Neurological: negative other than that listed in the HPI Allergy/Immunologic: negative other than that  listed in the HPI    Objective:   Blood pressure 110/72, pulse 68, resp. rate 16, height 5\' 9"  (1.753 m), weight 195 lb 6.4 oz (88.6 kg). Body mass index is 28.86 kg/m.   Physical Exam:  General: Alert, interactive, in no acute distress. Pleasant male.  Eyes: No conjunctival injection bilaterally, no discharge on the right, no discharge on the left and no Horner-Trantas dots present. PERRL bilaterally. EOMI without pain. No photophobia.  Ears: Right TM  pearly gray with normal light reflex, Left TM pearly gray with normal light reflex, Right TM intact without perforation and Left TM intact without perforation.  Nose/Throat: External nose within normal limits and septum midline. Turbinates edematous without discharge. Posterior oropharynx mildly erythematous without cobblestoning in the posterior oropharynx. Tonsils 2+ without exudates.  Tongue without thrush. Lungs: Clear to auscultation without wheezing, rhonchi or rales. No increased work of breathing. CV: Normal S1/S2. No murmurs. Capillary refill <2 seconds.  Skin: Warm and dry, without lesions or rashes. Neuro:   Grossly intact. No focal deficits appreciated. Responsive to questions.  Diagnostic studies: none      Gregory BondsJoel Tanylah Schnoebelen, MD  [facility]

## 2018-02-26 NOTE — Telephone Encounter (Signed)
Copied from CRM [id number]. Topic: Inquiry >> Feb 26, 2018 10:00 AM Crist InfanteHarrald, Kathy J wrote: Reason for CRM: pt saw Dr Caryl NeverBurchette on Monday and advised to call back if not better. Pt states his vertigo is better, but his left side balance is not better. Pt states this happened 2 yrs ago and he had to go to rehab. Pt would like to know what to do next.  Pt aware Dr Caryl NeverBurchette is out today.

## 2018-02-27 NOTE — Telephone Encounter (Signed)
I called the pt and informed him of the message below.  He is aware the referral was placed and someone will call with appt info. 

## 2018-02-27 NOTE — Telephone Encounter (Signed)
Set up vestibular rehab.

## 2018-03-03 ENCOUNTER — Ambulatory Visit: Payer: No Typology Code available for payment source | Attending: Family Medicine | Admitting: Physical Therapy

## 2018-03-03 ENCOUNTER — Encounter: Payer: Self-pay | Admitting: Physical Therapy

## 2018-03-03 ENCOUNTER — Telehealth: Payer: Self-pay

## 2018-03-03 ENCOUNTER — Other Ambulatory Visit: Payer: Self-pay

## 2018-03-03 DIAGNOSIS — R2681 Unsteadiness on feet: Secondary | ICD-10-CM | POA: Diagnosis present

## 2018-03-03 DIAGNOSIS — R42 Dizziness and giddiness: Secondary | ICD-10-CM | POA: Insufficient documentation

## 2018-03-03 NOTE — Telephone Encounter (Signed)
Referral has been placed into [facility] for Dr Luther Hearingeohs office.  R42 (ICD-10-CM) - Vertigo

## 2018-03-03 NOTE — Therapy (Signed)
[facility] [address] [place], [place], [address] Phone: [phone]   Fax:  [phone]  Physical Therapy Evaluation  Patient Details  Name: Gregory Sparks MRN: [id number] Date of Birth: 11/25/1980 Referring Provider: Kristian CoveyBruce W Burchette, MD   Encounter Date: 03/03/2018  PT End of Session - 03/03/18 0945    Visit Number  1    Number of Visits  5    Date for PT Re-Evaluation  04/17/18    Authorization Type  [employer]    PT Start Time  0845    PT Stop Time  0930    PT Time Calculation (min)  45 min    Activity Tolerance  Patient tolerated treatment well    Behavior During Therapy  [facility] for tasks assessed/performed       Past Medical History:  Diagnosis Date  . GERD (gastroesophageal reflux disease)     History reviewed. No pertinent surgical history.  There were no vitals filed for this visit.   Subjective Assessment - 03/03/18 0852    Subjective  2 years ago had his first episode of vertigo and participated in vestibular rehab.  Two weeks ago pt was golfing and had another episode of vertigo lasted a couple of days and then began to have balance issues.  Reports ringing in his ears, pulsatile thumping in L ear, does report having nausea but not vomiting.  Had his ears examined and they did report some hearing impairments.  Denies changes in vision, sensitivity to light, sound or smell.  Does have small headaches.  Denies issues with his neck.      Pertinent History  GERD    Limitations  Walking;Standing    Patient Stated Goals  To get more answers about the vertigo and balance issues    Currently in Pain?  No/denies         [facility] - 03/03/18 0858      Assessment   Medical Diagnosis  Vertigo     Referring Provider  Kristian CoveyBruce W Burchette, MD    Onset Date/Surgical Date  02/20/18    Prior Therapy  yes - vestibular rehab 2 years ago       Precautions   Precautions  None      Balance Screen    Has the patient fallen in the past 6 months  No    Has the patient had a decrease in activity level because of a fear of falling?   No    Is the patient reluctant to leave their home because of a fear of falling?   No      Home Public house managernvironment   Living Environment  Private residence    Living Arrangements  Spouse/significant other      Prior Function   Level of Independence  Independent    Vocation  Full time employment    Leisure  Golf      Observation/Other Assessments   Focus on Therapeutic Outcomes (FOTO)   Not indicated for this type of vertigo           Vestibular Assessment - 03/03/18 0902      Vestibular Assessment   General Observation  Pt frustrated that vertigo has returned      Symptom Behavior   Type of Dizziness  Imbalance    Frequency of Dizziness  intermittent    Duration of Dizziness  hours > days    Aggravating Factors  Activity in general    Relieving Factors  Rest      Occulomotor Exam   Occulomotor Alignment  Normal    Spontaneous  Absent    Gaze-induced  Absent    Smooth Pursuits  Intact    Saccades  Intact    Comment  convergence intact      Vestibulo-Occular Reflex   VOR to Slow Head Movement  Normal    VOR Cancellation  Normal    Comment  HIT: + bilat      Visual Acuity   Static  10    Dynamic  5      Positional Testing   Dix-Hallpike  Dix-Hallpike Right;Dix-Hallpike Left    Horizontal Canal Testing  Horizontal Canal Right;Horizontal Canal Left      Dix-Hallpike Right   Dix-Hallpike Right Duration  >1 minute    Dix-Hallpike Right Symptoms  Upbeat, right rotatory nystagmus      Dix-Hallpike Left   Dix-Hallpike Left Duration  >1 minute    Dix-Hallpike Left Symptoms  Upbeat, right rotatory nystagmus      Horizontal Canal Right   Horizontal Canal Right Duration  0    Horizontal Canal Right Symptoms  Normal      Horizontal Canal Left   Horizontal Canal Left Duration  0    Horizontal Canal Left Symptoms  Normal           Objective measurements completed on examination: See above findings.              PT Education - 03/03/18 0944    Education Details  clinical findings, PT POC, goals and handouts on vestibular hypofunction    Person(s) Educated  Patient    Methods  Explanation;Demonstration;Handout    Comprehension  Verbalized understanding          PT Long Term Goals - 03/03/18 0951      PT LONG TERM GOAL #1   Title  Pt will demonstrate independence with vestibular and balance HEP and be able to verbalize when it would be indicated for him to perform these exercises in the future    Time  6    Period  Weeks    Status  New    Target Date  04/17/18      PT LONG TERM GOAL #2   Title  Pt will improve use of VOR as indicated by 2-3 line difference in DVA    Baseline  5 line difference    Time  6    Period  Weeks    Status  New    Target Date  04/17/18      PT LONG TERM GOAL #3   Title  Pt will improve FGA score by 4 points to indicate decreased falls risk with ambulation    Baseline  TBD    Time  6    Period  Weeks    Status  New    Target Date  04/17/18      PT LONG TERM GOAL #4   Title  Pt will report return to golf and running >5 miles with <2/10 symptom intensity    Time  6    Period  Weeks    Status  New    Target Date  04/17/18             Plan - 03/03/18 0945    Clinical Impression Statement  Pt is a 37 year old male referred to Neuro OPPT for evaluation of vertigo and imbalance.  Pt's PMH is significant for the following:  GERD, seasonal allergies and vertigo 2 years ago that responded well to vestibular therapy. The following deficits were noted during pt's exam: L vestibular hypofunction as indicated by + HIT, 5 line difference on DVA and consistent R rotary nystagmus with L and R Hallpike-Dix, and impaired balance that is limiting his ability to work and safely participate in triathlon training. Pt would benefit from skilled PT to address these  impairments and functional limitations to maximize functional mobility independence and reduce falls risk.    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    Clinical Presentation  Stable    Clinical Presentation due to:  h/o vertigo x 2 years ago but responded well to vestibular rehab, GERD, seasonal allergies, dizziness is interfering with work and training for [employer] Decision Making  Low    Rehab Potential  Excellent    PT Frequency  1x / week    PT Duration  6 weeks    PT Treatment/Interventions  ADLs/Self Care Home Management;Gait training;Functional mobility training;Therapeutic activities;Therapeutic exercise;Balance training;Neuromuscular re-education;Patient/family education;Vestibular    PT Next Visit Plan  assess FGA; start x1 viewing    PT Home Exercise Plan  gaze adaptation, high level balance incorporate golf, running    Consulted and Agree with Plan of Care  Patient       Patient will benefit from skilled therapeutic intervention in order to improve the following deficits and impairments:  Decreased balance, Dizziness  Visit Diagnosis: Dizziness and giddiness  Unsteadiness on feet     Problem List Patient Active Problem List   Diagnosis Date Noted  . Vertigo 02/26/2018  . Perennial allergic rhinitis 10/08/2016  . Tensor fascia lata syndrome 02/24/2013  . GERD (gastroesophageal reflux disease) 03/30/2012  . H/O cold sores 03/30/2012    Dierdre Highman, PT, DPT 03/03/18    9:55 AM    [facility] [address] [place], [place], [address] Phone: [phone]   Fax:  [phone]  Name: Gregory Sparks MRN: [id number] Date of Birth: 1981-04-10

## 2018-03-03 NOTE — Telephone Encounter (Signed)
-----   Message from Alfonse SpruceJoel Louis Gallagher, MD sent at 02/26/2018 10:02 PM EDT ----- ENT referral placed.

## 2018-03-04 NOTE — Telephone Encounter (Signed)
Noted. Thanks much.   Tanazia Achee, MD [facility]  

## 2018-03-11 NOTE — Telephone Encounter (Signed)
Gregory Sparks denied his referral for dr Suszanne Connersteoh due to their office being booked out to September. [facility] due to them having sooner appointments. I have left the patient a Voicemail.

## 2018-03-12 ENCOUNTER — Encounter: Payer: Self-pay | Admitting: Physical Therapy

## 2018-03-12 ENCOUNTER — Ambulatory Visit: Payer: No Typology Code available for payment source | Admitting: Physical Therapy

## 2018-03-12 DIAGNOSIS — R42 Dizziness and giddiness: Secondary | ICD-10-CM

## 2018-03-12 DIAGNOSIS — R2681 Unsteadiness on feet: Secondary | ICD-10-CM

## 2018-03-12 NOTE — Patient Instructions (Signed)
Gaze Stabilization - Tip Card  1.Target must remain in focus, not blurry, and appear stationary while head is in motion. 2.Perform exercises with small head movements (45 to either side of midline). 3.Increase speed of head motion so long as target is in focus. 4.If you wear eyeglasses, be sure you can see target through lens (therapist will give specific instructions for bifocal / progressive lenses). 5.These exercises may provoke dizziness or nausea. Work through these symptoms. If too dizzy, slow head movement slightly. Rest between each exercise. 6.Exercises demand concentration; avoid distractions. 7.For safety, perform standing exercises close to a counter, wall, corner, or next to someone.  Copyright  VHI. All rights reserved.   Gaze Stabilization - Standing Feet Apart   Feet staggered, L foot forwards, R foot back, keeping eyes on target on wall 3 feet away, tilt head down slightly and move head side to side for 60 seconds. Repeat while moving head up and down for 60 seconds.  Take a 2 minute break and then switch feet and repeat.   Feet Partial Heel-Toe (Compliant Surface) Head Motion - Eyes Closed    Stand on compliant surface: pillow or folded yoga mat with right foot partially in front of the other. Close eyes and move head slowly, up and down 10 times, side to side 10 times. Switch feet and repeat head movements with eyes closed.

## 2018-03-12 NOTE — Therapy (Signed)
[facility] [address] [place], [place], [address] Phone: [phone]   Fax:  [phone]  Physical Therapy Treatment  Patient Details  Name: Gregory Sparks MRN: [id number] Date of Birth: 01/16/1981 Referring Provider: Kristian CoveyBruce W Burchette, MD   Encounter Date: 03/12/2018  PT End of Session - 03/12/18 1229    Visit Number  2    Number of Visits  5    Date for PT Re-Evaluation  04/17/18    Authorization Type  [employer]    Authorization Time Period  6 visits from 7/2 - 8/16    Authorization - Visit Number  2    Authorization - Number of Visits  6    PT Start Time  0935    PT Stop Time  1016    PT Time Calculation (min)  41 min    Activity Tolerance  Patient tolerated treatment well    Behavior During Therapy  [facility] for tasks assessed/performed       Past Medical History:  Diagnosis Date  . GERD (gastroesophageal reflux disease)     History reviewed. No pertinent surgical history.  There were no vitals filed for this visit.  Subjective Assessment - 03/12/18 0940    Subjective  Pt feels about 75% better, ran 3 miles but was a little more dizzy yesterday.  A little better today but L side still feels off.  Has appointment with ENT next week.    Pertinent History  GERD    Limitations  Walking;Standing    Patient Stated Goals  To get more answers about the vertigo and balance issues    Currently in Pain?  Yes    Pain Score  5     Pain Location  Ear    Pain Orientation  Right;Left    Pain Descriptors / Indicators  Aching         [facility] PT Assessment - 03/12/18 0944      Functional Gait  Assessment   Gait assessed   Yes    Gait Level Surface  Walks 20 ft in less than 5.5 sec, no assistive devices, good speed, no evidence for imbalance, normal gait pattern, deviates no more than 6 in outside of the 12 in walkway width.    Change in Gait Speed  Able to smoothly change walking speed without loss of balance or  gait deviation. Deviate no more than 6 in outside of the 12 in walkway width.    Gait with Horizontal Head Turns  Performs head turns smoothly with slight change in gait velocity (eg, minor disruption to smooth gait path), deviates 6-10 in outside 12 in walkway width, or uses an assistive device.    Gait with Vertical Head Turns  Performs head turns with no change in gait. Deviates no more than 6 in outside 12 in walkway width.    Gait and Pivot Turn  Pivot turns safely within 3 sec and stops quickly with no loss of balance.    Step Over Obstacle  Is able to step over 2 stacked shoe boxes taped together (9 in total height) without changing gait speed. No evidence of imbalance.    Gait with Narrow Base of Support  Is able to ambulate for 10 steps heel to toe with no staggering.    Gait with Eyes Closed  Walks 20 ft, uses assistive device, slower speed, mild gait deviations, deviates 6-10 in outside 12 in walkway width. Ambulates 20 ft in less than 9 sec  but greater than 7 sec.    Ambulating Backwards  Walks 20 ft, no assistive devices, good speed, no evidence for imbalance, normal gait    Steps  Alternating feet, no rail.    Total Score  28    FGA comment:  28/30       Vestibular Treatment/Exercise - 03/12/18 0952      Vestibular Treatment/Exercise   Gaze Exercises  X1 Viewing Horizontal;X1 Viewing Vertical      X1 Viewing Horizontal   Foot Position  feet apart, feet staggered R/L forwards    Reps  3    Comments  60 seconds each, L posterior tension HA       X1 Viewing Vertical   Foot Position  feet apart, feet staggered R/L forwards    Reps  3    Comments  60 seconds each, L posterior tension HA          Balance Exercises - 03/12/18 1243      Balance Exercises: Standing   Standing Eyes Closed  Narrow base of support (BOS);Head turns;Foam/compliant surface;Other reps (comment) feet together, partial SLS        PT Education - 03/12/18 1229    Education Details  balance  deficits; x1 viewing and balance HEP    Person(s) Educated  Patient    Methods  Explanation;Demonstration;Handout    Comprehension  Verbalized understanding;Returned demonstration      Gaze Stabilization - Tip Card  1.Target must remain in focus, not blurry, and appear stationary while head is in motion. 2.Perform exercises with small head movements (45 to either side of midline). 3.Increase speed of head motion so long as target is in focus. 4.If you wear eyeglasses, be sure you can see target through lens (therapist will give specific instructions for bifocal / progressive lenses). 5.These exercises may provoke dizziness or nausea. Work through these symptoms. If too dizzy, slow head movement slightly. Rest between each exercise. 6.Exercises demand concentration; avoid distractions. 7.For safety, perform standing exercises close to a counter, wall, corner, or next to someone.  Copyright  VHI. All rights reserved.   Gaze Stabilization - Standing Feet Apart   Feet staggered, L foot forwards, R foot back, keeping eyes on target on wall 3 feet away, tilt head down slightly and move head side to side for 60 seconds. Repeat while moving head up and down for 60 seconds.  Take a 2 minute break and then switch feet and repeat.   Feet Partial Heel-Toe (Compliant Surface) Head Motion - Eyes Closed    Stand on compliant surface: pillow or folded yoga mat with right foot partially in front of the other. Close eyes and move head slowly, up and down 10 times, side to side 10 times. Switch feet and repeat head movements with eyes closed.     PT Long Term Goals - 03/12/18 1241      PT LONG TERM GOAL #1   Title  Pt will demonstrate independence with vestibular and balance HEP and be able to verbalize when it would be indicated for him to perform these exercises in the future    Time  6    Period  Weeks    Status  New    Target Date  04/17/18      PT LONG TERM GOAL #2   Title  Pt will  improve use of VOR as indicated by 2-3 line difference in DVA    Baseline  5 line difference    Time  6    Period  Weeks    Status  New    Target Date  04/17/18      PT LONG TERM GOAL #3   Title  Pt will improve FGA score by 2 points to indicate decreased falls risk with ambulation    Baseline  28/30    Time  6    Period  Weeks    Status  Revised    Target Date  04/17/18      PT LONG TERM GOAL #4   Title  Pt will report return to golf and running >5 miles with <2/10 symptom intensity    Time  6    Period  Weeks    Status  New    Target Date  04/17/18            Plan - 03/12/18 1232    Clinical Impression Statement  Continued assessment of dynamic balance and gait with FGA; pt has greatest difficulty with maintaining balance with head turns to L and with vision removed.  Initiated x1 viewing training in standing with pt being able to progress to 60 seconds with more narrow BOS after one repetition.  Also intiated balance training on compliant surface without use of vision during head turns/nods for increased reliance on vestibular system.  Pt tolerated well with min-mod increase in symptoms.  Will continue to address in order to progress towards LTG.    Rehab Potential  Excellent    PT Frequency  1x / week    PT Duration  6 weeks    PT Treatment/Interventions  ADLs/Self Care Home Management;Gait training;Functional mobility training;Therapeutic activities;Therapeutic exercise;Balance training;Neuromuscular re-education;Patient/family education;Vestibular    PT Next Visit Plan  progress x 1 viewing - more dynamic.  Continue to work on dynamic balance on compliant surfaces, vision removed, incorporate golf/rotation    PT Home Exercise Plan  gaze adaptation, high level balance incorporate golf, running    Consulted and Agree with Plan of Care  Patient       Patient will benefit from skilled therapeutic intervention in order to improve the following deficits and impairments:   Decreased balance, Dizziness  Visit Diagnosis: Dizziness and giddiness  Unsteadiness on feet     Problem List Patient Active Problem List   Diagnosis Date Noted  . Vertigo 02/26/2018  . Perennial allergic rhinitis 10/08/2016  . Tensor fascia lata syndrome 02/24/2013  . GERD (gastroesophageal reflux disease) 03/30/2012  . H/O cold sores 03/30/2012    Dierdre Highman, PT, DPT 03/12/18    12:45 PM    [facility] [address] [place], [place], [address] Phone: [phone]   Fax:  [phone]  Name: Gregory Sparks MRN: [id number] Date of Birth: 04/20/1981

## 2018-03-17 NOTE — Telephone Encounter (Signed)
[facility]

## 2018-03-26 ENCOUNTER — Ambulatory Visit: Payer: No Typology Code available for payment source | Admitting: Physical Therapy

## 2018-04-02 ENCOUNTER — Ambulatory Visit: Payer: No Typology Code available for payment source | Attending: Family Medicine | Admitting: Physical Therapy

## 2018-04-02 ENCOUNTER — Encounter: Payer: Self-pay | Admitting: Physical Therapy

## 2018-04-02 DIAGNOSIS — R2681 Unsteadiness on feet: Secondary | ICD-10-CM | POA: Diagnosis present

## 2018-04-02 DIAGNOSIS — R42 Dizziness and giddiness: Secondary | ICD-10-CM | POA: Insufficient documentation

## 2018-04-02 NOTE — Patient Instructions (Signed)
Gaze Stabilization - Tip Card  1.Target must remain in focus, not blurry, and appear stationary while head is in motion. 2.Perform exercises with small head movements (45 to either side of midline). 3.Increase speed of head motion so long as target is in focus. 4.If you wear eyeglasses, be sure you can see target through lens (therapist will give specific instructions for bifocal / progressive lenses). 5.These exercises may provoke dizziness or nausea. Work through these symptoms. If too dizzy, slow head movement slightly. Rest between each exercise. 6.Exercises demand concentration; avoid distractions. 7.For safety, perform standing exercises close to a counter, wall, corner, or next to someone.  Copyright  VHI. All rights reserved.   Gaze Stabilization - Standing Feet Apart   Feet staggered R/L foot forwards - standing on pillows and letter on busy background, keeping eyes on target on wall 3 feet away, tilt head down slightly and move head side to side for 60 seconds to 2 minutes. Repeat while moving head up and down for 60 seconds to 2 minutes.  Do 2-3 sessions per day.   Feet Partial Heel-Toe (Compliant Surface) Head Motion - Eyes Closed    Stand on compliant surface: pillows with right foot partially in front of the other. Close eyes and move head slowly, up and down 10 times, side to side 10 times. Repeat 2 times per session.   Practice swinging golf club in the yard with eyes closed so the inner ear is controlling balance.

## 2018-04-02 NOTE — Therapy (Signed)
[facility] [address] [place], [place], [address] Phone: [phone]   Fax:  [phone]  Physical Therapy Treatment and D/C Summary  Patient Details  Name: Gregory Sparks MRN: [id number] Date of Birth: 04-03-1981 Referring Provider: Eulas Post, MD   Encounter Date: 04/02/2018  PT End of Session - 04/02/18 0842    Visit Number  3    Number of Visits  5    Date for PT Re-Evaluation  04/17/18    Authorization Type  [employer]    Authorization Time Period  6 visits from 7/2 - 8/16    Authorization - Visit Number  3    Authorization - Number of Visits  6    PT Start Time  0803    PT Stop Time  0839    PT Time Calculation (min)  36 min    Activity Tolerance  Patient tolerated treatment well    Behavior During Therapy  [facility] for tasks assessed/performed       Past Medical History:  Diagnosis Date  . GERD (gastroesophageal reflux disease)     History reviewed. No pertinent surgical history.  There were no vitals filed for this visit.  Subjective Assessment - 04/02/18 0805    Subjective  Went to ENT last week, stated "there isn't much they can do."  Pt hoping today will be his last day.  Back up to running 6-7 miles without any issues.    Pertinent History  GERD    Limitations  Walking;Standing    Patient Stated Goals  To get more answers about the vertigo and balance issues    Currently in Pain?  No/denies         [facility] PT Assessment - 04/02/18 0810      Functional Gait  Assessment   Gait assessed   Yes    Gait Level Surface  Walks 20 ft in less than 5.5 sec, no assistive devices, good speed, no evidence for imbalance, normal gait pattern, deviates no more than 6 in outside of the 12 in walkway width.    Change in Gait Speed  Able to smoothly change walking speed without loss of balance or gait deviation. Deviate no more than 6 in outside of the 12 in walkway width.    Gait with Horizontal Head  Turns  Performs head turns smoothly with no change in gait. Deviates no more than 6 in outside 12 in walkway width    Gait with Vertical Head Turns  Performs head turns with no change in gait. Deviates no more than 6 in outside 12 in walkway width.    Gait and Pivot Turn  Pivot turns safely within 3 sec and stops quickly with no loss of balance.    Step Over Obstacle  Is able to step over 2 stacked shoe boxes taped together (9 in total height) without changing gait speed. No evidence of imbalance.    Gait with Narrow Base of Support  Is able to ambulate for 10 steps heel to toe with no staggering.    Gait with Eyes Closed  Walks 20 ft, uses assistive device, slower speed, mild gait deviations, deviates 6-10 in outside 12 in walkway width. Ambulates 20 ft in less than 9 sec but greater than 7 sec. - veered but performed in less than 7 seconds   Ambulating Backwards  Walks 20 ft, no assistive devices, good speed, no evidence for imbalance, normal gait    Steps  Alternating feet,  no rail.    Total Score  29    FGA comment:  29/30         Vestibular Assessment - 04/02/18 0808      Visual Acuity   Static  10    Dynamic  7                Vestibular Treatment/Exercise - 04/02/18 0814      Vestibular Treatment/Exercise   Gaze Exercises  X1 Viewing Horizontal;X1 Viewing Vertical      X1 Viewing Horizontal   Foot Position  feet staggered, R/L forwards, progressed to busy background    Reps  3    Comments  2 minutes, 1 minute with busy background      X1 Viewing Vertical   Foot Position  feet staggered, R/L forwards, progressed to busy background    Reps  3    Comments  2 minutes, 1 minute with busy background         Balance Exercises - 04/02/18 0840      Balance Exercises: Standing   Standing Eyes Closed  Narrow base of support (BOS);Head turns;Foam/compliant surface feet staggered R/L, 10 reps head turns/nods    Other Standing Exercises  Standing on foam performing 8  full golf swings without any dizziness        PT Education - 04/02/18 0842    Education Details  goals met, final HEP    Person(s) Educated  Patient    Methods  Explanation;Demonstration;Handout    Comprehension  Verbalized understanding;Returned demonstration          PT Long Term Goals - 04/02/18 6734      PT LONG TERM GOAL #1   Title  Pt will demonstrate independence with vestibular and balance HEP and be able to verbalize when it would be indicated for him to perform these exercises in the future    Time  6    Period  Weeks    Status  Achieved      PT LONG TERM GOAL #2   Title  Pt will improve use of VOR as indicated by 2-3 line difference in DVA    Baseline  5 line difference > 3 line difference    Time  6    Period  Weeks    Status  Achieved      PT LONG TERM GOAL #3   Title  Pt will improve FGA score by 2 points to indicate decreased falls risk with ambulation    Baseline  28/30 > 29    Time  6    Period  Weeks    Status  Partially Met      PT LONG TERM GOAL #4   Title  Pt will report return to golf and running >5 miles with <2/10 symptom intensity    Baseline  7 miles; has not returned to golf    Time  6    Period  Weeks    Status  Partially Met            Plan - 04/02/18 0913    Clinical Impression Statement  Pt has made good progress and has met 2/4 LTG partially meeting other two.  Pt demonstrates improvement in use of VOR, improved balance and safety with ambulation.  Pt continues to have increased difficulty with maintaining balance with lack of visual input.  Pt has not returned to golfing due to fear of causing the dizziness to return.  Pt demonstrated ability to  safely perform golf swings on compliant surface. Discussed gradual return to golf and that return to activity would likely not cause another acute episode of dizziness.  Provided pt with final HEP to perform to continue to address balance impairments.  Pt agreeable to D/C.    Rehab  Potential  Excellent    PT Frequency  1x / week    PT Duration  6 weeks    PT Treatment/Interventions  ADLs/Self Care Home Management;Gait training;Functional mobility training;Therapeutic activities;Therapeutic exercise;Balance training;Neuromuscular re-education;Patient/family education;Vestibular    PT Next Visit Plan  D/C    Consulted and Agree with Plan of Care  Patient       Patient will benefit from skilled therapeutic intervention in order to improve the following deficits and impairments:  Decreased balance, Dizziness  Visit Diagnosis: Dizziness and giddiness  Unsteadiness on feet     Problem List Patient Active Problem List   Diagnosis Date Noted  . Vertigo 02/26/2018  . Perennial allergic rhinitis 10/08/2016  . Tensor fascia lata syndrome 02/24/2013  . GERD (gastroesophageal reflux disease) 03/30/2012  . H/O cold sores 03/30/2012   PHYSICAL THERAPY DISCHARGE SUMMARY  Visits from Start of Care: 3  Current functional level related to goals / functional outcomes: See LTG and impression statement   Remaining deficits: Impaired balance   Education / Equipment: HEP  Plan: Patient agrees to discharge.  Patient goals were partially met. Patient is being discharged due to being pleased with the current functional level.  ?????     Rico Junker, PT, DPT 04/02/18    9:18 AM    [facility] [place] [address] [place], [place], [address] Phone: [phone]   Fax:  [phone]  Name: Gregory Sparks MRN: [id number] Date of Birth: October 24, 1980

## 2018-04-07 ENCOUNTER — Encounter: Payer: No Typology Code available for payment source | Admitting: Physical Therapy

## 2018-04-13 ENCOUNTER — Ambulatory Visit: Payer: No Typology Code available for payment source | Admitting: Family Medicine

## 2018-04-13 ENCOUNTER — Encounter: Payer: Self-pay | Admitting: Family Medicine

## 2018-04-13 VITALS — BP 110/70 | HR 58 | Temp 98.6°F | Wt 198.1 lb

## 2018-04-13 DIAGNOSIS — M546 Pain in thoracic spine: Secondary | ICD-10-CM

## 2018-04-13 MED ORDER — CYCLOBENZAPRINE HCL 10 MG PO TABS: 10.0000 mg | ORAL_TABLET | Freq: Three times a day (TID) | ORAL | 0 refills | Status: DC | PRN

## 2018-04-13 NOTE — Progress Notes (Signed)
  Subjective:     Patient ID: Gregory Sparks, male   DOB: 02/14/1981, 37 y.o.   MRN: [id number]  HPI Patient seen with acute low back pain. He's had similar problems in the past. Was seen here couple years ago with some acute muscle spasm thoracic area. Current headache first noted last Friday. He has some pain and muscle spasm mostly in the lower thoracic region. His job requires sitting a lot. He does alternate sometimes with standing. He has fairly good supportive chair. He has some pain which radiates to the buttock area. He states he frequently feels "twisted "from spasm. He tried heat which made symptoms worse. Ice helped slightly. He's had recurrent tendency to muscle spasm in the past  Denies any radiculitis symptoms. Denies lower extremity numbness or weakness.  Past Medical History:  Diagnosis Date  . GERD (gastroesophageal reflux disease)    No past surgical history on file.  reports that he has never smoked. He has never used smokeless tobacco. His alcohol and drug histories are not on file. family history includes Alcohol abuse in his father, maternal grandmother, and mother; Hyperlipidemia in his father and mother; Hypertension in his father, maternal grandmother, and mother. No Known Allergies   Review of Systems  Constitutional: Negative for chills and fever.  Gastrointestinal: Negative for abdominal pain.  Genitourinary: Negative for dysuria.  Musculoskeletal: Positive for back pain.  Neurological: Negative for weakness and numbness.       Objective:   Physical Exam  Constitutional: He appears well-developed and well-nourished.  Cardiovascular: Normal rate and regular rhythm.  Pulmonary/Chest: Effort normal and breath sounds normal.  Musculoskeletal:  He has some palpable muscle tension and spasm right mid to lower thoracic region. This is noticeably different than the left side. No spinal tenderness.  Neurological:  Full strength throughout       Assessment:      Muscle spasm right thoracic region. He's had similar occurrences in the past. Possibly exacerbated by prolonged periods with sitting at work    Plan:     -Recommend setting up some physical therapy -We discussed the importance of good stretching and good core strengthening -Short-term trial of Flexeril 10 mg daily at bedtime. He is cautioned about sedation with this -Continue ice as needed for symptom relief  Kristian CoveyBruce W Alajia Schmelzer MD [facility]\

## 2018-06-08 ENCOUNTER — Ambulatory Visit: Payer: Self-pay | Admitting: Family Medicine

## 2018-06-08 NOTE — Telephone Encounter (Signed)
FYI

## 2018-06-08 NOTE — Telephone Encounter (Signed)
Called in saying he was bitten by a neighbor's dog on Friday evening about 7:40.   He was walking his dog when his neighbor's dog came out of no where and attacked his dog.   Gregory Sparks was fighting the neighbor's dog off of his dog when he got bitten on his right hand and a scratch on his right thigh.    He has a puncture wound on his right hand and some scratches.   He has been cleaning the wounds with hydrogen peroxide.  "They are healing ok but I am worried about rabies".   The neighbor's dog is up to date on his rabies vaccines.   The owner showed Gregory Sparks proof of vaccination. Gregory Sparks is not sure when his last tetanus vaccination was.   He got out of the [employer] in 2006 so probably not since then that he can recall.  I scheduled him with Dr. Caryl Never for 06/09/18 at 10:45.   I let him know to go to the ED if his wounds started becoming red, draining, he gets body aches and chills with a fever, or pain.     I also let him know for future reference that any time he is bitten by an animal he should be evaluated as soon as possible in an urgent care or ED in case he were to need antibiotics or rabies vaccinations.    He verbalized understanding of all these instructions.   Reason for Disposition . [1] Last tetanus shot > 10 years ago AND [2] any wound (e.g., cut, scrape)  Answer Assessment - Initial Assessment Questions 1. ANIMAL: "What type of animal caused the bite?" "Is the injury from a bite or a claw?" If the animal is a dog or a cat, ask: "Was it a pet or a stray?" "Was it acting ill or behaving strangely?"     Bit by a dog on Friday evening about 7:40PM.    The dog came out of no where and attacked my dog.   I was fighting the dog off of my dog. 2. LOCATION: "Where is the bite located?"      My right hand has a puncture wound and right leg on thigh is a scratch mark. 3. SIZE: "How big is the bite?" "What does it look like?"      Right puncture mark.   It's healing now.   Right thigh is a scratch. 4.  ONSET: "When did the bite happen?" (Minutes or hours ago)      Friday evening at 7:40PM.    5. CIRCUMSTANCES: "Tell me how this happened."      I was fighting off a dog that was attacking my dog. 6. TETANUS: "When was the last tetanus booster?"     I don't know. 7. PREGNANCY: "Is there any chance you are pregnant?" "When was your last menstrual period?"     N/A  Protocols used: ANIMAL BITE-A-AH

## 2018-06-09 ENCOUNTER — Other Ambulatory Visit: Payer: Self-pay

## 2018-06-09 ENCOUNTER — Ambulatory Visit: Payer: No Typology Code available for payment source | Admitting: Family Medicine

## 2018-06-09 ENCOUNTER — Encounter: Payer: Self-pay | Admitting: Family Medicine

## 2018-06-09 VITALS — BP 112/80 | HR 75 | Temp 98.1°F | Ht 69.0 in | Wt 194.1 lb

## 2018-06-09 DIAGNOSIS — S71151A Open bite, right thigh, initial encounter: Secondary | ICD-10-CM

## 2018-06-09 DIAGNOSIS — W540XXA Bitten by dog, initial encounter: Secondary | ICD-10-CM

## 2018-06-09 DIAGNOSIS — S61451A Open bite of right hand, initial encounter: Secondary | ICD-10-CM | POA: Diagnosis not present

## 2018-06-09 DIAGNOSIS — Z23 Encounter for immunization: Secondary | ICD-10-CM

## 2018-06-09 NOTE — Patient Instructions (Signed)
Animal Bite Animal bites can range from mild to serious. An animal bite can result in a scratch on the skin, a deep open cut, a puncture of the skin, a crush injury, or tearing away of the skin or a body part. A small bite from a house pet will usually not cause serious problems. However, some animal bites can become infected or injure a bone or other tissue. Bites from certain animals can be more dangerous because of the risk of spreading rabies, which is a serious viral infection. This risk is higher with bites from stray animals or wild animals, such as raccoons, foxes, skunks, and bats. Dogs are responsible for most animal bites. Children are bitten more often than adults. What are the signs or symptoms? Common symptoms of an animal bite include:  Pain.  Bleeding.  Swelling.  Bruising.  How is this diagnosed? This condition may be diagnosed based on a physical exam and medical history. Your health care provider will examine the wound and ask for details about the animal and how the bite happened. You may also have tests, such as:  Blood tests to check for infection or to determine if surgery is needed.  X-rays to check for damage to bones or joints.  Culture test. This uses a sample of fluid from the wound to check for infection.  How is this treated? Treatment varies depending on the location and type of animal bite and your medical history. Treatment may include:  Wound care. This often includes cleaning the wound, flushing the wound with saline solution, and applying a bandage (dressing). Sometimes, the wound is left open to heal because of the high risk of infection. However, in some cases, the wound may be closed with stitches (sutures), staples, skin glue, or adhesive strips.  Antibiotic medicine.  Tetanus shot.  Rabies treatment if the animal could have rabies.  In some cases, bites that have become infected may require IV antibiotics and surgical treatment in the  hospital. Follow these instructions at home: Wound care  Follow instructions from your health care provider about how to take care of your wound. Make sure you: ? Wash your hands with soap and water before you change your dressing. If soap and water are not available, use hand sanitizer. ? Change your dressing as told by your health care provider. ? Leave sutures, skin glue, or adhesive strips in place. These skin closures may need to be in place for 2 weeks or longer. If adhesive strip edges start to loosen and curl up, you may trim the loose edges. Do not remove adhesive strips completely unless your health care provider tells you to do that.  Check your wound every day for signs of infection. Watch for: ? Increasing redness, swelling, or pain. ? Fluid, blood, or pus. General instructions  Take or apply over-the-counter and prescription medicines only as told by your health care provider.  If you were prescribed an antibiotic, take or apply it as told by your health care provider. Do not stop using the antibiotic even if your condition improves.  Keep the injured area raised (elevated) above the level of your heart while you are sitting or lying down, if this is possible.  If directed, apply ice to the injured area. ? Put ice in a plastic bag. ? Place a towel between your skin and the bag. ? Leave the ice on for 20 minutes, 2-3 times per day.  Keep all follow-up visits as told by your health care   provider. This is important. Contact a health care provider if:  You have increasing redness, swelling, or pain at the site of your wound.  You have a general feeling of sickness (malaise).  You feel nauseous or you vomit.  You have pain that does not get better. Get help right away if:  You have a red streak extending away from your wound.  You have fluid, blood, or pus coming from your wound.  You have a fever or chills.  You have trouble moving your injured area.  You have  numbness or tingling extending beyond the wound. This information is not intended to replace advice given to you by your health care provider. Make sure you discuss any questions you have with your health care provider. Document Released: 05/07/2011 Document Revised: 12/27/2015 Document Reviewed: 01/04/2015 Elsevier Interactive Patient Education  2018 Elsevier Inc.  

## 2018-06-09 NOTE — Progress Notes (Signed)
  Subjective:     Patient ID: Gregory Sparks, male   DOB: 1981/07/25, 37 y.o.   MRN: [id number]  HPI Patient seen with dog bites to the right hand and right thigh.  Injury occurred this past Friday.  He was walking his dog and neighbors dog which is a pitbull ran out to attack his dog.  He had to fight the dog off several times.  He sustained a laceration proximal dorsal right third finger and also superficial injury dorsum right wrist.  Similar lesion right anterior thigh.  Last tetanus was over 10 years ago.  He irrigated this well and cleaned with some peroxide initially.  No signs of secondary infection.  His wife is a Publishing rights manager.  He has had no fever or chills.  Neighbor's dog has been fully vaccinated for rabies and he provided proof of that.  He is also kept inside and in a fence most of the time  He is scheduled to participate in a full Ironman event this weekend in [place].  Ran 10 miles yesterday without difficulty  Past Medical History:  Diagnosis Date  . GERD (gastroesophageal reflux disease)    History reviewed. No pertinent surgical history.  reports that he has never smoked. He has never used smokeless tobacco. His alcohol and drug histories are not on file. family history includes Alcohol abuse in his father, maternal grandmother, and mother; Hyperlipidemia in his father and mother; Hypertension in his father, maternal grandmother, and mother. No Known Allergies   Review of Systems  Constitutional: Negative for chills and fever.       Objective:   Physical Exam  Constitutional: He appears well-developed and well-nourished.  Cardiovascular: Normal rate and regular rhythm.  Pulmonary/Chest: Effort normal and breath sounds normal.  Skin:  He has non-gaping 1/2 cm laceration right third finger dorsally just distal to the MCP joint.  No signs of secondary infection.  He has abrasion dorsum right wrist.  Superficial abrasion right anterior thigh with some surrounding  bruising.  No signs of secondary infection       Assessment:     Dog bites involving right thigh and right hand as above.  No signs of secondary infection.  Dog involved has been fully vaccinated    Plan:     -Tetanus booster given -Follow-up promptly for any signs of secondary infection  [name] MD [facility] at [facility]

## 2018-08-06 ENCOUNTER — Other Ambulatory Visit: Payer: Self-pay | Admitting: Family Medicine

## 2018-10-04 ENCOUNTER — Other Ambulatory Visit: Payer: Self-pay | Admitting: Family Medicine

## 2019-02-03 ENCOUNTER — Ambulatory Visit: Payer: No Typology Code available for payment source | Admitting: Family Medicine

## 2019-02-03 ENCOUNTER — Other Ambulatory Visit: Payer: Self-pay

## 2019-02-03 ENCOUNTER — Ambulatory Visit (INDEPENDENT_AMBULATORY_CARE_PROVIDER_SITE_OTHER): Payer: No Typology Code available for payment source | Admitting: Family Medicine

## 2019-02-03 ENCOUNTER — Encounter: Payer: Self-pay | Admitting: Family Medicine

## 2019-02-03 VITALS — BP 122/80 | HR 74 | Temp 98.5°F | Wt 191.4 lb

## 2019-02-03 DIAGNOSIS — H6982 Other specified disorders of Eustachian tube, left ear: Secondary | ICD-10-CM | POA: Diagnosis not present

## 2019-02-03 NOTE — Progress Notes (Signed)
   Subjective:    Patient ID: Gregory Sparks, male    DOB: 05/23/1981, 38 y.o.   MRN: [id number]  HPI Here for 2 weeks of muffled hearing and mild pain in the left ear. No fever or sinus congestion. No ST or cough. He does have seasonal allergies and these cause some nasal congestion and sneezing. He takes Careers adviser daily and uses Flonase, but only once a week.    Review of Systems  Constitutional: Negative.   HENT: Positive for ear pain, hearing loss, postnasal drip and sneezing. Negative for sinus pressure, sinus pain and sore throat.   Eyes: Negative.   Respiratory: Negative.        Objective:   Physical Exam Constitutional:      General: He is not in acute distress.    Appearance: Normal appearance.  HENT:     Right Ear: Tympanic membrane, ear canal and external ear normal.     Left Ear: Tympanic membrane, ear canal and external ear normal.     Nose: Nose normal.     Mouth/Throat:     Pharynx: Oropharynx is clear.  Eyes:     Conjunctiva/sclera: Conjunctivae normal.  Pulmonary:     Effort: Pulmonary effort is normal.     Breath sounds: Normal breath sounds.  Lymphadenopathy:     Cervical: No cervical adenopathy.  Neurological:     Mental Status: He is alert.           Assessment & Plan:  Eustachian tube dysfunction. He will increase the Flonase to using it daily. Add Mucinex D BID. Recheck prn. Gershon Crane, MD

## 2019-02-10 ENCOUNTER — Other Ambulatory Visit: Payer: Self-pay

## 2019-02-10 ENCOUNTER — Ambulatory Visit (INDEPENDENT_AMBULATORY_CARE_PROVIDER_SITE_OTHER): Payer: No Typology Code available for payment source | Admitting: Family Medicine

## 2019-02-10 DIAGNOSIS — H9192 Unspecified hearing loss, left ear: Secondary | ICD-10-CM

## 2019-02-10 MED ORDER — PREDNISONE 20 MG PO TABS: ORAL_TABLET | ORAL | 0 refills | Status: DC

## 2019-02-10 NOTE — Progress Notes (Signed)
Patient ID: Gregory Sparks, male   DOB: Oct 17, 1980, 38 y.o.   MRN: [id number]  This visit type was conducted due to national recommendations for restrictions regarding the COVID-19 pandemic in an effort to limit this patient's exposure and mitigate transmission in our community.   Virtual Visit via Video Note  I connected with Gregory Sparks on 02/10/19 at  3:00 PM EDT by a video enabled telemedicine application and verified that I am speaking with the correct person using two identifiers.  Location patient: home Location provider:work or home office Persons participating in the virtual visit: patient, provider  I discussed the limitations of evaluation and management by telemedicine and the availability of in person appointments. The patient expressed understanding and agreed to proceed.   HPI: Patient has had some persistent issues with decreased hearing following left ear.  He presented here 1 week ago and was diagnosed with eustachian tube dysfunction.  He had had about 2 weeks of what he described as "muffled" hearing at that time.  He had some allergy type symptoms with nasal congestion.  Had been taking his allergy medicines and frequently until last visit.  Apparently had no signs of cerumen impaction or obvious effusion.  He was instructed to take some Mucinex D but he had some palpitations with that.  He has been taking Flonase regularly.  He has had vertigo in the past but denies any vertigo and has some mild chronic tinnitus which is unchanged.  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  No consistent headaches.   ROS: See pertinent positives and negatives per HPI.  Past Medical History:  Diagnosis Date  . GERD (gastroesophageal reflux disease)     No past surgical history on file.  Family History  Problem Relation Age of Onset  . Alcohol  abuse Mother   . Hypertension Mother   . Hyperlipidemia Mother   . Alcohol abuse Father   . Hypertension Father   . Hyperlipidemia Father   . Alcohol abuse Maternal Grandmother   . Hypertension Maternal Grandmother     SOCIAL HX: Non-smoker   Current Outpatient Medications:  .  cyclobenzaprine (FLEXERIL) 10 MG tablet, Take 1 tablet (10 mg total) by mouth 3 (three) times daily as needed for muscle spasms., Disp: 30 tablet, Rfl: 0 .  fexofenadine (ALLEGRA ALLERGY) 180 MG tablet, Take 1 tablet (180 mg total) by mouth daily., Disp: 30 tablet, Rfl: 5 .  fluticasone (FLONASE) 50 MCG/ACT nasal spray, Place 1 spray into both nostrils 2 (two) times daily., Disp: 16 g, Rfl: 5 .  predniSONE (DELTASONE) 20 MG tablet, Take two tablets once daily for 7 days., Disp: 14 tablet, Rfl: 0 .  valACYclovir (VALTREX) 1000 MG tablet, USE AS DIRECTED IF NEEDED FOR COLD SORES, Disp: 60 tablet, Rfl: 1  EXAM:  VITALS per patient if applicable:  GENERAL: alert, oriented, appears well and in no acute distress  HEENT: atraumatic, conjunttiva clear, no obvious abnormalities on inspection of external nose and ears  NECK: normal movements of the head and neck  LUNGS: on inspection no signs of respiratory distress, breathing rate appears normal, no obvious gross SOB, gasping or wheezing  CV: no obvious cyanosis  MS: moves all visible extremities without noticeable abnormality  PSYCH/NEURO: pleasant and cooperative, no obvious depression or anxiety, speech and thought processing grossly intact  ASSESSMENT AND PLAN:  Discussed the following assessment and plan:  Patient relates approximately 3-week history of some hearing loss on the left ear.  He had recent exam which did not show any cerumen or obvious effusion.  Question sudden sensorineural hearing loss  -We recommend trial of prednisone 40 mg daily for 1 week.  If he is not seeing improvement over the next couple days will recommend further testing  minimally with audiology assessment possibly ENT referral   I discussed the assessment and treatment plan with the patient. The patient was provided an opportunity to ask questions and all were answered. The patient agreed with the plan and demonstrated an understanding of the instructions.   The patient was advised to call back or seek an in-person evaluation if the symptoms worsen or if the condition fails to improve as anticipated.   Evelena PeatBruce Keshanna Riso, MD

## 2019-03-11 ENCOUNTER — Other Ambulatory Visit: Payer: Self-pay

## 2019-03-11 ENCOUNTER — Ambulatory Visit: Payer: No Typology Code available for payment source | Admitting: Allergy & Immunology

## 2019-03-11 ENCOUNTER — Encounter: Payer: Self-pay | Admitting: Allergy & Immunology

## 2019-03-11 VITALS — BP 118/92 | HR 68 | Temp 97.7°F | Resp 16 | Ht 68.5 in | Wt 191.4 lb

## 2019-03-11 DIAGNOSIS — J3089 Other allergic rhinitis: Secondary | ICD-10-CM | POA: Diagnosis not present

## 2019-03-11 DIAGNOSIS — R42 Dizziness and giddiness: Secondary | ICD-10-CM

## 2019-03-11 MED ORDER — FEXOFENADINE HCL 180 MG PO TABS: 180.0000 mg | ORAL_TABLET | Freq: Every day | ORAL | 11 refills | Status: AC

## 2019-03-11 MED ORDER — FLUTICASONE PROPIONATE 50 MCG/ACT NA SUSP: 1.0000 | Freq: Two times a day (BID) | NASAL | 11 refills | Status: AC

## 2019-03-11 NOTE — Progress Notes (Signed)
FOLLOW UP  Date of Service/Encounter:  03/11/19   Assessment:   Perennial allergic rhinitis (dust mites only)  Vertigo - stable (episodes every 1-2 years)  Plan/Recommendations:   1. Perennial allergic rhinitis with a history of vertigo - Continue with Flonase up to two sprays per nostril twice daily. - Continue with Allegra 180mg  daily as needed. - It is ok to take another antihistamine on particularly bad days, such as Zyrtec, as you are doing.   2. Return in about 1 year (around 03/10/2020). This can be an in-person, a virtual Webex or a telephone follow up visit.  Subjective:   Gregory Sparks is a 38 y.o. male presenting today for follow up of  Chief Complaint  Patient presents with  . Allergic Rhinitis     Gregory KaJason Sparks has a history of the following: Patient Active Problem List   Diagnosis Date Noted  . Vertigo 02/26/2018  . Perennial allergic rhinitis 10/08/2016  . Tensor fascia lata syndrome 02/24/2013  . GERD (gastroesophageal reflux disease) 03/30/2012  . H/O cold sores 03/30/2012    History obtained from: chart review and patient.  Gregory CowerJason is a 38 y.o. male presenting for a follow up visit.  He was last seen in June 2019.  At that time, we increased his Flonase to 2 sprays per nostril twice daily for 1 week before going back to the original dosing.  We also started him on a prednisone Dosepak.  Referred him to ENT for further evaluation.  We discontinued the Zyrtec and started Allegra 1 tablet daily instead.  Since the last visit, he has done well. He remains on the fluticasone, which he uses at least once daily but will increase to twice daily on particularly bad days. He is using Allegra as well and will add on a Zyrtec if needed. He has not needed any antibiotics since the last visit. Within the last two months, he was swimming and developed a watery sensation in his ear. This never improved and he was eventually started on prednisone due to hearing loss (on the  left ear) and his hearing has corrected completely.   He has a longstanding history of vertigo and has gone through vestibular rehabilitation in the past. He has all of the materials that are needed should he need this again since he knows all of the maneuvers.   Otherwise, there have been no changes to his past medical history, surgical history, family history, or social history. 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     Review of Systems  Constitutional: Negative.  Negative for fever, malaise/fatigue and weight loss.  HENT: Negative.  Negative for congestion, ear discharge and ear pain.   Eyes: Negative for pain, discharge and redness.  Respiratory: Negative for cough, sputum production, shortness of breath and wheezing.   Cardiovascular: Negative.  Negative for chest pain and palpitations.  Gastrointestinal: Negative for abdominal pain and heartburn.  Skin: Negative.  Negative for itching and rash.  Neurological: Negative for dizziness and headaches.  Endo/Heme/Allergies: Negative for environmental allergies. Does not bruise/bleed easily.       Objective:   Blood pressure (!) 118/92, pulse 68, temperature 97.7 F (36.5 C), temperature source Temporal, resp. rate 16, height 5' 8.5" (1.74 m), weight 191 lb 6.4 oz (86.8 kg), SpO2 98 %. Body mass index is 28.68 kg/m.   Physical Exam:  Physical Exam  Constitutional: He appears well-developed.  HENT:  Head: Normocephalic and atraumatic.  Right Ear:  Tympanic membrane, external ear and ear canal normal.  Left Ear: Tympanic membrane, external ear and ear canal normal.  Nose: No mucosal edema, rhinorrhea, nasal deformity or septal deviation. No epistaxis. Right sinus exhibits no maxillary sinus tenderness and no frontal sinus tenderness. Left sinus exhibits no maxillary sinus tenderness and no frontal sinus tenderness.  Mouth/Throat: Uvula is midline and oropharynx is clear and moist. Mucous membranes are  not pale and not dry.  There is no obviously tympanic membrane abnormality noted on either side.   Eyes: Pupils are equal, round, and reactive to light. Conjunctivae and EOM are normal. Right eye exhibits no chemosis and no discharge. Left eye exhibits no chemosis and no discharge. Right conjunctiva is not injected. Left conjunctiva is not injected.  Cardiovascular: Normal rate, regular rhythm and normal heart sounds.  Respiratory: Effort normal and breath sounds normal. No accessory muscle usage. No tachypnea. No respiratory distress. He has no wheezes. He has no rhonchi. He has no rales. He exhibits no tenderness.  Moving air well in all lung fields.   Lymphadenopathy:    He has no cervical adenopathy.  Neurological: He is alert.  Skin: No abrasion, no petechiae and no rash noted. Rash is not papular, not vesicular and not urticarial. No erythema. No pallor.  No urticaria or eczematous lesions noted.   Psychiatric: He has a normal mood and affect.     Diagnostic studies: none    Salvatore Marvel, MD  [facility]

## 2019-03-11 NOTE — Patient Instructions (Addendum)
1. Perennial allergic rhinitis with a history of vertigo - Continue with Flonase up to two sprays per nostril twice daily. - Continue with Allegra 180mg  daily as needed. - It is ok to take another antihistamine on particularly bad days, such as Zyrtec, as you are doing.   2. Return in about 1 year (around 03/10/2020). This can be an in-person, a virtual Webex or a telephone follow up visit.   Please inform us of any Emergency Department visits, hospitalizations, or changes in symptoms. Call us before going to the ED for breathing or allergy symptoms since we might be able to fit you in for a sick visit. Feel free to contact us anytime with any questions, problems, or concerns.  It was a pleasure to see you again today!  Websites that have reliable patient information: 1. American Academy of Asthma, Allergy, and Immunology: www.aaaai.org 2. Food Allergy Research and Education (FARE): foodallergy.org 3. Mothers of Asthmatics: http://www.asthmacommunitynetwork.org 4. American College of Allergy, Asthma, and Immunology: www.acaai.org  "Like" [place] on Facebook and Instagram for our latest updates!      Make sure you are registered to vote! If you have moved or changed any of your contact information, you will need to get this updated before voting!  In some cases, you MAY be able to register to vote online: [email or web address]    Voter ID laws are NOT going into effect for the General Election in November 2020! DO NOT let this stop you from exercising your right to vote!   Absentee voting is the SAFEST way to vote during the coronavirus pandemic!   Download and print an absentee ballot request form at [email or web address] or you can scan the QR code below with your smart phone:      More information on absentee ballots can be found here: [email or web address]

## 2019-03-24 ENCOUNTER — Other Ambulatory Visit: Payer: Self-pay

## 2019-03-24 ENCOUNTER — Ambulatory Visit (INDEPENDENT_AMBULATORY_CARE_PROVIDER_SITE_OTHER): Payer: No Typology Code available for payment source | Admitting: Family Medicine

## 2019-03-24 DIAGNOSIS — M545 Low back pain, unspecified: Secondary | ICD-10-CM

## 2019-03-24 NOTE — Progress Notes (Signed)
Patient ID: Gregory Sparks, male   DOB: 08/12/1981, 38 y.o.   MRN: [id number]  This visit type was conducted due to national recommendations for restrictions regarding the COVID-19 pandemic in an effort to limit this patient's exposure and mitigate transmission in our community.   Virtual Visit via Video Note  I connected with Gregory Sparks on 03/24/19 pain is 11:00 AM EDT by a video enabled telemedicine application and verified that I am speaking with the correct person using two identifiers.  Location patient: home Location provider:work or home office Persons participating in the virtual visit: patient, provider  I discussed the limitations of evaluation and management by telemedicine and the availability of in person appointments. The patient expressed understanding and agreed to proceed.   HPI: Low back pain which started sometime around last weekend.  Has had some similar recurrent back issues in the past.  He feels that he has significant muscle spasm and on video he does appear to be slightly twisted to one side.  He states that his job requires sitting about 8 hours/day but he has fairly good back support.  He is not aware of specific injury but does play golf about once per week and wonders if that may be aggravating.  He also played laser tag with his children this weekend and was jumping in and out of the bed of a truck but again does not recall specific injury  Current pain is somewhat diffuse and bilateral.  No radiculitis symptoms.  Denies any lower extremity numbness or weakness.  He has tried heat without relief.  He had leftover Flexeril without improvement.  He had recently gone to chiropractor and had x-rays which were reportedly unremarkable.  He states that his left leg was determined to be shorter than his right and he was given a heel lift without improvement.  Also tried some McKenzie stretches at home without much improvement.    ROS: See pertinent positives and negatives  per HPI.  Past Medical History:  Diagnosis Date  . GERD (gastroesophageal reflux disease)     No past surgical history on file.  Family History  Problem Relation Age of Onset  . Alcohol abuse Mother   . Hypertension Mother   . Hyperlipidemia Mother   . Alcohol abuse Father   . Hypertension Father   . Hyperlipidemia Father   . Alcohol abuse Maternal Grandmother   . Hypertension Maternal Grandmother     SOCIAL HX: Non-smoker   Current Outpatient Medications:  .  fexofenadine (ALLEGRA ALLERGY) 180 MG tablet, Take 1 tablet (180 mg total) by mouth daily., Disp: 30 tablet, Rfl: 11 .  fluticasone (FLONASE) 50 MCG/ACT nasal spray, Place 1 spray into both nostrils 2 (two) times daily., Disp: 16 g, Rfl: 11 .  NIACIN, ANTIHYPERLIPIDEMIC, PO, Take by mouth., Disp: , Rfl:  .  valACYclovir (VALTREX) 1000 MG tablet, USE AS DIRECTED IF NEEDED FOR COLD SORES, Disp: 60 tablet, Rfl: 1  EXAM:  VITALS per patient if applicable:  GENERAL: alert, oriented, appears well and in no acute distress  HEENT: atraumatic, conjunttiva clear, no obvious abnormalities on inspection of external nose and ears  NECK: normal movements of the head and neck  LUNGS: on inspection no signs of respiratory distress, breathing rate appears normal, no obvious gross SOB, gasping or wheezing  CV: no obvious cyanosis  MS: moves all visible extremities without noticeable abnormality  PSYCH/NEURO: pleasant and cooperative, no obvious depression or anxiety, speech and thought processing grossly intact  ASSESSMENT AND  PLAN:  Discussed the following assessment and plan:  Bilateral low back pain without sciatica, unspecified chronicity - Plan: Ambulatory referral to Sports Medicine   Patient has had similar recurrent pain in the past.  He appears to be having significant muscle spasm and states that with episodes of recurrence he feels "slightly twisted ".  He has tried Flexeril without improvement.  -We also  suggested muscle massage and consider topical sports cream -Also encouraged to get back to some good core strengthening activities and continue stretches -He would like to consult with sports medicine given the fact this has been so recurrent   I discussed the assessment and treatment plan with the patient. The patient was provided an opportunity to ask questions and all were answered. The patient agreed with the plan and demonstrated an understanding of the instructions.   The patient was advised to call back or seek an in-person evaluation if the symptoms worsen or if the condition fails to improve as anticipated.   Carolann Littler, MD

## 2019-03-24 NOTE — Progress Notes (Deleted)
  Subjective:     Patient ID: Gregory Sparks, male   DOB: August 12, 1981, 38 y.o.   MRN: [id number]  HPI Patient complains of some muscle spasms in his lower back region and low back pain with onset sometime last weekend.  He had similar issues in the past.  He does not recall specific injury.  He does play golf about once per week and wonders if that may have aggravated.  He also was playing laser tag with some of his children last weekend and members jumping out of the bed of a truck and wonders if he may have aggravated things then.  He had some leftover Flexeril which he took without improvement.  He states that heat seems to make this worse.  He has been to chiropractor in the past and had x-rays not long ago which were reportedly normal.  He states that the chiropractor diagnosed that his left leg was slightly shorter than the right and he was given a heel lift and thus far has not made much difference.  Denies any radiculitis symptoms.  No lower extremity weakness or numbness.  His job involves sitting 8 hours/day.  He does have fairly good back support and is try to work on posture.  He is tried [employer] without much improvement.  Past Medical History:  Diagnosis Date  . GERD (gastroesophageal reflux disease)    No past surgical history on file.  reports that he has never smoked. He has never used smokeless tobacco. No history on file for alcohol and drug. family history includes Alcohol abuse in his father, maternal grandmother, and mother; Hyperlipidemia in his father and mother; Hypertension in his father, maternal grandmother, and mother. No Known Allergies   Review of Systems  Constitutional: Negative for activity change, appetite change and fever.  Respiratory: Negative for cough and shortness of breath.   Cardiovascular: Negative for chest pain and leg swelling.  Gastrointestinal: Negative for abdominal pain and vomiting.  Genitourinary: Negative for dysuria, flank pain and  hematuria.  Musculoskeletal: Positive for back pain. Negative for joint swelling.  Neurological: Negative for weakness and numbness.       Objective:   Physical Exam Constitutional:      General: He is not in acute distress.    Appearance: He is well-developed.  Neck:     Thyroid: No thyromegaly.  Cardiovascular:     Rate and Rhythm: Normal rate and regular rhythm.     Heart sounds: Normal heart sounds. No murmur.  Pulmonary:     Effort: Pulmonary effort is normal. No respiratory distress.     Breath sounds: Normal breath sounds. No wheezing or rales.  Skin:    Findings: No rash.  Neurological:     Mental Status: He is alert and oriented to person, place, and time.     Cranial Nerves: No cranial nerve deficit.     Deep Tendon Reflexes: Reflexes are normal and symmetric.        Assessment:     ***    Plan:     ***

## 2019-03-30 ENCOUNTER — Other Ambulatory Visit: Payer: Self-pay

## 2019-03-30 ENCOUNTER — Ambulatory Visit: Payer: No Typology Code available for payment source | Admitting: Family Medicine

## 2019-03-30 ENCOUNTER — Encounter: Payer: Self-pay | Admitting: Family Medicine

## 2019-03-30 ENCOUNTER — Ambulatory Visit (INDEPENDENT_AMBULATORY_CARE_PROVIDER_SITE_OTHER)
Admission: RE | Admit: 2019-03-30 | Discharge: 2019-03-30 | Disposition: A | Discharge status: Home / Self Care | Payer: No Typology Code available for payment source | Source: Ambulatory Visit | Attending: Family Medicine | Admitting: Family Medicine

## 2019-03-30 VITALS — BP 110/84 | HR 78 | Ht 68.5 in | Wt 193.0 lb

## 2019-03-30 DIAGNOSIS — M545 Low back pain, unspecified: Secondary | ICD-10-CM

## 2019-03-30 DIAGNOSIS — M533 Sacrococcygeal disorders, not elsewhere classified: Secondary | ICD-10-CM | POA: Insufficient documentation

## 2019-03-30 DIAGNOSIS — M999 Biomechanical lesion, unspecified: Secondary | ICD-10-CM | POA: Diagnosis not present

## 2019-03-30 NOTE — Assessment & Plan Note (Signed)
Patient does have more of a sacroiliac dysfunction.  Discussed with patient in great length.  We discussed the muscle imbalances, ergonomics, patient work with Product/process development scientist.  Patient is to increase activity slowly over the course the next several days.  Patient will follow-up with me again 4 to 8 weeks.

## 2019-03-30 NOTE — Patient Instructions (Addendum)
Good to see you.  Xray downstairs Ice 20 minutes 2 times daily. Usually after activity and before bed. Exercises 3 times a week.  Monitor at eye level  Turmeric 500mg  daily  Tart cherry extract 1200mg  at night Vitamin D 2000 IU daily  See me again in 5-6 weeks

## 2019-03-30 NOTE — Progress Notes (Signed)
Gregory Sparks  D.O. [facility] [address] [place], [place] [address] Phone: [phone] Subjective:   Gregory Sparks, Gregory Sparks, am serving as a scribe for Dr. Antoine PrimasZachary .  Referred by Dr. Caryl Sparks, Gregory FortisBruce W, MD  CC: Low back pain  BJY:[id number]:Subjective  Gregory Sparks is a 38 y.o. male coming in with complaint of lower back pain. Pain over the spine and musculature on both sides. Pain can radiate into the glutes bilaterally. Can also have radiating pain into left anterior thigh. Pain increases with sitting. Being more active improves his pain. Has to sit for 8-9 hours of work. Has a standing desk at work but has been working at home due to [employer]. Has been seeing chiropractor. Is also wearing a heel lift in left shoe per his chiropractor. Patient also golf's and feels like his motion is limited with rotation.      Past Medical History:  Diagnosis Date  . GERD (gastroesophageal reflux disease)    No past surgical history on file. Social History   Socioeconomic History  . Marital status: Unknown    Spouse name: Not on file  . Number of children: Not on file  . Years of education: Not on file  . Highest education level: Not on file  Occupational History  . Not on file  Social Needs  . Financial resource strain: Not on file  . Food insecurity    Worry: Not on file    Inability: Not on file  . Transportation needs    Medical: Not on file    Non-medical: Not on file  Tobacco Use  . Smoking status: Never Smoker  . Smokeless tobacco: Never Used  Substance and Sexual Activity  . Alcohol use: Not on file  . Drug use: Not on file  . Sexual activity: Not on file  Lifestyle  . Physical activity    Days per week: Not on file    Minutes per session: Not on file  . Stress: Not on file  Relationships  . Social Musicianconnections    Talks on phone: Not on file    Gets together: Not on file    Attends religious service: Not on file    Active member of club or organization: Not on file   Attends meetings of clubs or organizations: Not on file    Relationship status: Not on file  Other Topics Concern  . Not on file  Social History Narrative  . Not on file   No Known Allergies Family History  Problem Relation Age of Onset  . Alcohol abuse Mother   . Hypertension Mother   . Hyperlipidemia Mother   . Alcohol abuse Father   . Hypertension Father   . Hyperlipidemia Father   . Alcohol abuse Maternal Grandmother   . Hypertension Maternal Grandmother      Current Outpatient Medications (Cardiovascular):  Marland Kitchen.  NIACIN, ANTIHYPERLIPIDEMIC, PO, Take by mouth.  Current Outpatient Medications (Respiratory):  .  fexofenadine (ALLEGRA ALLERGY) 180 MG tablet, Take 1 tablet (180 mg total) by mouth daily. .  fluticasone (FLONASE) 50 MCG/ACT nasal spray, Place 1 spray into both nostrils 2 (two) times daily.    Current Outpatient Medications (Other):  .  valACYclovir (VALTREX) 1000 MG tablet, USE AS DIRECTED IF NEEDED FOR COLD SORES    Past medical history, social, surgical and family history all reviewed in electronic medical record.  No pertanent information unless stated regarding to the chief complaint.   Review of Systems:  No headache, visual  changes, nausea, vomiting, diarrhea, constipation, dizziness, abdominal pain, skin rash, fevers, chills, night sweats, weight loss, swollen lymph nodes, body aches, joint swelling, muscle aches, chest pain, shortness of breath, mood changes.   Objective  Blood pressure 110/84, pulse 78, height 5' 8.5" (1.74 m), weight 193 lb (87.5 kg), SpO2 97 %.  General: No apparent distress alert and oriented x3 mood and affect normal, dressed appropriately.  HEENT: Pupils equal, extraocular movements intact  Respiratory: Patient's speak in full sentences and does not appear short of breath  Cardiovascular: No lower extremity edema, non tender, no erythema  Skin: Warm dry intact with no signs of infection or rash on extremities or on axial  skeleton.  Abdomen: Soft nontender  Neuro: Cranial nerves II through XII are intact, neurovascularly intact in all extremities with 2+ DTRs and 2+ pulses.  Lymph: No lymphadenopathy of posterior or anterior cervical chain or axillae bilaterally.  Gait normal with good balance and coordination.  MSK:  Non tender with full range of motion and good stability and symmetric strength and tone of shoulders, elbows, wrist, hip, knee and ankles bilaterally.  Low back exam shows the patient some tenderness over the right sacroiliac joint.  Positive Faber test on the right side.  Negative straight leg test.  Patient does have maybe a possible mild spondylolisthesis with flexion extension at L4-L5.  Minorly tender to palpation in the paraspinal musculature more around L5-S1.  Osteopathic findings  C6 flexed rotated and side bent left T3 extended rotated and side bent right inhaled third rib T7 extended rotated and side bent left L2 flexed rotated and side bent right Sacrum right on right    Impression and Recommendations:     This case required medical decision making of moderate complexity. The above documentation has been reviewed and is accurate and complete Lyndal Pulley, DO       Note: This dictation was prepared with Dragon dictation along with smaller phrase technology. Any transcriptional errors that result from this process are unintentional.

## 2019-03-30 NOTE — Assessment & Plan Note (Signed)
Decision today to treat with OMT was based on Physical Exam  After verbal consent patient was treated with HVLA, ME, FPR techniques in cervical, thoracic, lumbar and sacral areas  Patient tolerated the procedure well with improvement in symptoms  Patient given exercises, stretches and lifestyle modifications  See medications in patient instructions if given  Patient will follow up in 4-6 weeks 

## 2019-04-06 ENCOUNTER — Telehealth: Payer: Self-pay | Admitting: Family Medicine

## 2019-04-06 NOTE — Telephone Encounter (Signed)
Copied from [place] [id number]. Topic: General - Call Back - No Documentation >> Apr 06, 2019 12:20 PM Erick Blinks wrote: Reason for CRM: Pt called to discuss recent imaging. Nurse call back request Best contact: [phone]

## 2019-04-06 NOTE — Telephone Encounter (Signed)
Spoke with patient per result.  

## 2019-05-10 NOTE — Progress Notes (Signed)
Tawana ScaleZach  D.O. [facility] [address] [place], [place] [address] Phone: [phone] Subjective:   Gregory Sparks, Valerie Wolf, am serving as a scribe for Dr. Antoine PrimasZachary .   CC: Low back pain follow-up  ION:[id number]:Subjective   03/30/2019 Patient does have more of a sacroiliac dysfunction.  Discussed with patient in great length.  We discussed the muscle imbalances, ergonomics, patient work with Event organiserathletic trainer.  Patient is to increase activity slowly over the course the next several days.  Patient will follow-up with me again 4 to 8 weeks.  Update 05/11/2019 Maryjean KaJason Goostree is a 38 y.o. male coming in with complaint of back pain. Is 80% better. Has not been sitting as much. Performs HEP which has helped. Has not tried running yet but that is what caused the increase in his pain. Has done some light lifting and has felt ok.    Lumbar back x-ray showed the patient did have spondylosis from L3-S1 with facet degenerative changes from L4-S1  Past Medical History:  Diagnosis Date  . GERD (gastroesophageal reflux disease)    No past surgical history on file. Social History   Socioeconomic History  . Marital status: Unknown    Spouse name: Not on file  . Number of children: Not on file  . Years of education: Not on file  . Highest education level: Not on file  Occupational History  . Not on file  Social Needs  . Financial resource strain: Not on file  . Food insecurity    Worry: Not on file    Inability: Not on file  . Transportation needs    Medical: Not on file    Non-medical: Not on file  Tobacco Use  . Smoking status: Never Smoker  . Smokeless tobacco: Never Used  Substance and Sexual Activity  . Alcohol use: Not on file  . Drug use: Not on file  . Sexual activity: Not on file  Lifestyle  . Physical activity    Days per week: Not on file    Minutes per session: Not on file  . Stress: Not on file  Relationships  . Social Musicianconnections    Talks on phone: Not on file    Gets  together: Not on file    Attends religious service: Not on file    Active member of club or organization: Not on file    Attends meetings of clubs or organizations: Not on file    Relationship status: Not on file  Other Topics Concern  . Not on file  Social History Narrative  . Not on file   No Known Allergies Family History  Problem Relation Age of Onset  . Alcohol abuse Mother   . Hypertension Mother   . Hyperlipidemia Mother   . Alcohol abuse Father   . Hypertension Father   . Hyperlipidemia Father   . Alcohol abuse Maternal Grandmother   . Hypertension Maternal Grandmother      Current Outpatient Medications (Cardiovascular):  Marland Kitchen.  NIACIN, ANTIHYPERLIPIDEMIC, PO, Take by mouth.  Current Outpatient Medications (Respiratory):  .  fexofenadine (ALLEGRA ALLERGY) 180 MG tablet, Take 1 tablet (180 mg total) by mouth daily. .  fluticasone (FLONASE) 50 MCG/ACT nasal spray, Place 1 spray into both nostrils 2 (two) times daily.    Current Outpatient Medications (Other):  .  valACYclovir (VALTREX) 1000 MG tablet, USE AS DIRECTED IF NEEDED FOR COLD SORES    Past medical history, social, surgical and family history all reviewed in electronic medical record.  No pertanent information unless stated regarding to the chief complaint.   Review of Systems:  No headache, visual changes, nausea, vomiting, diarrhea, constipation, dizziness, abdominal pain, skin rash, fevers, chills, night sweats, weight loss, swollen lymph nodes, body aches, joint swelling, muscle aches, chest pain, shortness of breath, mood changes.   Objective  There were no vitals taken for this visit. Systems examined below as of    General: No apparent distress alert and oriented x3 mood and affect normal, dressed appropriately.  HEENT: Pupils equal, extraocular movements intact  Respiratory: Patient's speak in full sentences and does not appear short of breath  Cardiovascular: No lower extremity edema, non tender,  no erythema  Skin: Warm dry intact with no signs of infection or rash on extremities or on axial skeleton.  Abdomen: Soft nontender  Neuro: Cranial nerves II through XII are intact, neurovascularly intact in all extremities with 2+ DTRs and 2+ pulses.  Lymph: No lymphadenopathy of posterior or anterior cervical chain or axillae bilaterally.  Gait normal with good balance and coordination.  MSK:  Non tender with full range of motion and good stability and symmetric strength and tone of shoulders, elbows, wrist, hip, knee and ankles bilaterally.  Back Exam:  Inspection: Mild loss of lordosis Motion: Flexion 45 deg, Extension 25 deg, Side Bending to 45 deg bilaterally,  Rotation to 45 deg bilaterally  SLR laying: Negative  XSLR laying: Negative  Palpable tenderness: Mild tenderness to palpation of paraspinal musculature lumbar spine record of the left. FABER: negative. Sensory change: Gross sensation intact to all lumbar and sacral dermatomes.  Reflexes: 2+ at both patellar tendons, 2+ at achilles tendons, Babinski's downgoing.  Strength at foot  Plantar-flexion: 5/5 Dorsi-flexion: 5/5 Eversion: 5/5 Inversion: 5/5  Leg strength  Quad: 5/5 Hamstring: 5/5 Hip flexor: 5/5 Hip abductors: 5/5  Gait unremarkable.  Osteopathic findings  T9 extended rotated and side bent left L2 flexed rotated and side bent right Sacrum right on right    Impression and Recommendations:     This case required medical decision making of moderate complexity. The above documentation has been reviewed and is accurate and complete Lyndal Pulley, DO       Note: This dictation was prepared with Dragon dictation along with smaller phrase technology. Any transcriptional errors that result from this process are unintentional.

## 2019-05-11 ENCOUNTER — Ambulatory Visit (INDEPENDENT_AMBULATORY_CARE_PROVIDER_SITE_OTHER): Payer: No Typology Code available for payment source | Admitting: Family Medicine

## 2019-05-11 ENCOUNTER — Encounter: Payer: Self-pay | Admitting: Family Medicine

## 2019-05-11 ENCOUNTER — Other Ambulatory Visit: Payer: Self-pay

## 2019-05-11 VITALS — BP 128/90 | HR 63 | Ht 68.5 in | Wt 195.0 lb

## 2019-05-11 DIAGNOSIS — M533 Sacrococcygeal disorders, not elsewhere classified: Secondary | ICD-10-CM | POA: Diagnosis not present

## 2019-05-11 DIAGNOSIS — M999 Biomechanical lesion, unspecified: Secondary | ICD-10-CM

## 2019-05-11 NOTE — Assessment & Plan Note (Signed)
Tightness.  Making significant progress already.  Attempted osteopathic manipulation again.  Discussed posture and ergonomics, patient will follow-up with me again in 4 to 8 weeks

## 2019-05-11 NOTE — Assessment & Plan Note (Signed)
Decision today to treat with OMT was based on Physical Exam  After verbal consent patient was treated with HVLA, ME, FPR techniques in  thoracic, lumbar and sacral areas  Patient tolerated the procedure well with improvement in symptoms  Patient given exercises, stretches and lifestyle modifications  See medications in patient instructions if given  Patient will follow up in 4-8 weeks 

## 2019-05-11 NOTE — Patient Instructions (Signed)
Good to see you.  Start a walk-run progression:  3o minutes up to 3 times a week only   Once you have reached 30 mins: - Run 2 mins, then walk 1 min week 1 -Then run 3 mins, and walk 1 min week 2  -Then run 4 mins, and walk 1 min week 3  -Then run 5 mins, and walk 1 min. -Slowly build up weekly to running 30 mins nonstop.  If painful at any of the steps, back up one step.  One knee down one knee up tilt pelvis forward.  Hands overhead then rotate to upper leg.  Hold 10 seconds, relax, repeat and do other side as well.  Very important when after being in a flexed position for a long amount of time.    See em again in 2-3 months!

## 2019-07-13 ENCOUNTER — Ambulatory Visit: Payer: No Typology Code available for payment source | Admitting: Family Medicine

## 2019-08-17 ENCOUNTER — Other Ambulatory Visit: Payer: Self-pay | Admitting: Family Medicine

## 2019-10-17 ENCOUNTER — Other Ambulatory Visit: Payer: Self-pay | Admitting: Family Medicine

## 2019-11-09 ENCOUNTER — Telehealth: Payer: Self-pay | Admitting: Family Medicine

## 2019-11-09 ENCOUNTER — Encounter: Payer: Self-pay | Admitting: Family Medicine

## 2019-11-09 ENCOUNTER — Telehealth (INDEPENDENT_AMBULATORY_CARE_PROVIDER_SITE_OTHER): Payer: No Typology Code available for payment source | Admitting: Family Medicine

## 2019-11-09 DIAGNOSIS — M545 Low back pain, unspecified: Secondary | ICD-10-CM

## 2019-11-09 DIAGNOSIS — E785 Hyperlipidemia, unspecified: Secondary | ICD-10-CM

## 2019-11-09 DIAGNOSIS — M546 Pain in thoracic spine: Secondary | ICD-10-CM

## 2019-11-09 MED ORDER — TIZANIDINE HCL 2 MG PO TABS: 2.0000 mg | ORAL_TABLET | Freq: Two times a day (BID) | ORAL | 0 refills | Status: DC | PRN

## 2019-11-09 NOTE — Telephone Encounter (Signed)
-----   Message from Terressa Koyanagi, DO sent at 11/09/2019 11:59 AM EST ----- Follow up in 1-2 weeks with Dr. Caryl Never or Dr. Selena Batten. Back pain. Thanks!

## 2019-11-09 NOTE — Patient Instructions (Signed)
-  heat  -topical menthol and aleve as needed per instructions for pain  -I sent the medication(s) we discussed to your pharmacy: Meds ordered this encounter  Medications  . tiZANidine (ZANAFLEX) 2 MG tablet    Sig: Take 1 tablet (2 mg total) by mouth every 12 (twelve) hours as needed for muscle spasms.    Dispense:  30 tablet    Refill:  0   -follow up in 1-2 weeks with Dr. Caryl Never or me.  -I will have my assistant mail you some exercises for the back  -for the cholesterol eat a healthy diet such as the Mediterranean or DASH diet, get 150 minutes of aerobic exercise per week and switch to coffee that is filtered through a paper filter. [facility] in [facility].  Please let us know if you have any questions or concerns regarding this prescription.  I hope you are feeling better soon! Seek care promptly if your symptoms worsen, new concerns arise or you are not improving with treatment.

## 2019-11-09 NOTE — Telephone Encounter (Signed)
Pt is scheduled with [facility] on 3/17 at 8:30 for follow up

## 2019-11-09 NOTE — Progress Notes (Signed)
Virtual Visit via Video Note  I connected with Gregory Sparks  on 11/09/19 at 11:00 AM EST by a video enabled telemedicine application and verified that I am speaking with the correct person using two identifiers.  Location patient: home, [place] Location provider:work or home office Persons participating in the virtual visit: patient, provider  I discussed the limitations of evaluation and management by telemedicine and the availability of in person appointments. The patient expressed understanding and agreed to proceed.   HPI:  Acute visit for back pain: -started 2 days ago after golfing -  Newer activity recently for him - 2nd time at the range -pain is in the mid to lower back, R side, throbbing sharp pain, TTP in particular muscle -denies radiation to legs, buttocks, fevers, urinary symptoms, blood in the urine, weakness, numbness -he tried some tylenol 500mg  which did not help much -he has a history of some back issues  Hyperlipidemia: -reports gets labs at [place] and last set of labs showed elevated cholesterol -he was quite worried as exercises and tries to eat healthy -they started him on fish oil and he has a recheck in a few months at the [place] -he wonders what else to do  ROS: See pertinent positives and negatives per HPI.  Past Medical History:  Diagnosis Date  . GERD (gastroesophageal reflux disease)     History reviewed. No pertinent surgical history.  Family History  Problem Relation Age of Onset  . Alcohol abuse Mother   . Hypertension Mother   . Hyperlipidemia Mother   . Alcohol abuse Father   . Hypertension Father   . Hyperlipidemia Father   . Alcohol abuse Maternal Grandmother   . Hypertension Maternal Grandmother     SOCIAL HX: see hpi   Current Outpatient Medications:  .  Cholecalciferol (VITAMIN D3 PO), Take 50 mcg by mouth daily., Disp: , Rfl:  .  fluticasone (FLONASE) 50 MCG/ACT nasal spray, Place 1 spray into both nostrils 2 (two) times daily., Disp: 16 g,  Rfl: 11 .  NIACIN, ANTIHYPERLIPIDEMIC, PO, Take by mouth., Disp: , Rfl:  .  Omega-3 Fatty Acids (FISH OIL PO), Take 1,000 mg by mouth. , Disp: , Rfl:  .  TURMERIC PO, Take 530 mg by mouth., Disp: , Rfl:  .  valACYclovir (VALTREX) 1000 MG tablet, USE AS DIRECTED IF NEEDED FOR COLD SORES, Disp: 60 tablet, Rfl: 1 .  fexofenadine (ALLEGRA ALLERGY) 180 MG tablet, Take 1 tablet (180 mg total) by mouth daily., Disp: 30 tablet, Rfl: 11 .  tiZANidine (ZANAFLEX) 2 MG tablet, Take 1 tablet (2 mg total) by mouth every 12 (twelve) hours as needed for muscle spasms., Disp: 30 tablet, Rfl: 0  EXAM:  VITALS per patient if applicable:  GENERAL: alert, oriented, appears well and in no acute distress  HEENT: atraumatic, conjunttiva clear, no obvious abnormalities on inspection of external nose and ears  NECK: normal movements of the head and neck  LUNGS: on inspection no signs of respiratory distress, breathing rate appears normal, no obvious gross SOB, gasping or wheezing  CV: no obvious cyanosis  MS: moves all visible extremities without noticeable abnormality, he points to paraspinal muscles in lower thoracic and upper lumbar region on the R as area of concern with TTP on self exam, + facet loading on the R, seems to get up and down out of the chair fairly easily  PSYCH/NEURO: pleasant and cooperative, no obvious depression or anxiety, speech and thought processing grossly intact  ASSESSMENT AND PLAN:  Discussed the following assessment and plan:  Acute right-sided low back pain without sciatica  Acute right-sided thoracic back pain  Hyperlipidemia, unspecified hyperlipidemia type  -we discussed possible serious and likely etiologies, options for evaluation and workup, limitations of telemedicine visit vs in person visit, treatment, treatment risks and precautions. Pt prefers to treat via telemedicine empirically rather then risking or undertaking an in person visit at this moment. Query  musculoskeletal as most likely etiology of the back pain vs other. Opted to do trial of muscle relaxer at night, heat, topical menthol and naproxen per instructions with close follow up with PCP or myself. Also, will have assistant send HEP. Patient agrees to seek prompt in person care if worsening, new symptoms arise, or if is not improving with treatment. For the cholesterol recommended healthy low sugar whole foods based diet such as the Mediterranean or DASH diet with regular exercise. Also, found out he is drinking unfiltered coffee which can sometimes impact cholesterol values in some individuals. He will try switching to paper filters. [facility].    I discussed the assessment and treatment plan with the patient. The patient was provided an opportunity to ask questions and all were answered. The patient agreed with the plan and demonstrated an understanding of the instructions.   The patient was advised to call back or seek an in-person evaluation if the symptoms worsen or if the condition fails to improve as anticipated.   Terressa Koyanagi, DO

## 2019-11-16 ENCOUNTER — Other Ambulatory Visit: Payer: Self-pay | Admitting: Family Medicine

## 2019-11-16 NOTE — Telephone Encounter (Signed)
Rx done. 

## 2019-11-16 NOTE — Telephone Encounter (Signed)
May refill once. 

## 2019-11-17 ENCOUNTER — Ambulatory Visit: Payer: No Typology Code available for payment source | Admitting: Family Medicine

## 2019-12-29 ENCOUNTER — Ambulatory Visit: Payer: No Typology Code available for payment source | Attending: Internal Medicine

## 2019-12-29 DIAGNOSIS — Z20822 Contact with and (suspected) exposure to covid-19: Secondary | ICD-10-CM

## 2019-12-30 LAB — NOVEL CORONAVIRUS, NAA: SARS-CoV-2, NAA: NOT DETECTED

## 2019-12-30 LAB — SARS-COV-2, NAA 2 DAY TAT

## 2020-03-14 ENCOUNTER — Ambulatory Visit: Payer: No Typology Code available for payment source | Admitting: Allergy & Immunology

## 2020-07-19 ENCOUNTER — Other Ambulatory Visit: Payer: Self-pay | Admitting: Family Medicine

## 2020-08-22 ENCOUNTER — Other Ambulatory Visit: Payer: Self-pay

## 2020-08-22 ENCOUNTER — Other Ambulatory Visit: Payer: No Typology Code available for payment source

## 2020-08-22 DIAGNOSIS — Z20822 Contact with and (suspected) exposure to covid-19: Secondary | ICD-10-CM

## 2020-08-24 LAB — SPECIMEN STATUS REPORT

## 2020-08-24 LAB — SARS-COV-2, NAA 2 DAY TAT

## 2020-08-24 LAB — NOVEL CORONAVIRUS, NAA: SARS-CoV-2, NAA: NOT DETECTED

## 2020-09-20 ENCOUNTER — Ambulatory Visit (INDEPENDENT_AMBULATORY_CARE_PROVIDER_SITE_OTHER): Payer: No Typology Code available for payment source | Admitting: Internal Medicine

## 2020-09-20 ENCOUNTER — Encounter: Payer: Self-pay | Admitting: Internal Medicine

## 2020-09-20 ENCOUNTER — Other Ambulatory Visit: Payer: Self-pay

## 2020-09-20 VITALS — BP 120/84 | HR 81 | Temp 98.1°F | Wt 195.5 lb

## 2020-09-20 DIAGNOSIS — K219 Gastro-esophageal reflux disease without esophagitis: Secondary | ICD-10-CM | POA: Diagnosis not present

## 2020-09-20 DIAGNOSIS — R079 Chest pain, unspecified: Secondary | ICD-10-CM

## 2020-09-20 NOTE — Progress Notes (Signed)
Established Patient Office Visit     This visit occurred during the SARS-CoV-2 public health emergency.  Safety protocols were in place, including screening questions prior to the visit, additional usage of staff PPE, and extensive cleaning of exam room while observing appropriate contact time as indicated for disinfecting solutions.    CC/Reason for Visit: Chest wall pain.  HPI: Gregory Sparks is a 40 y.o. male who is coming in today for the above mentioned reasons.  He has no past medical history of significance, he is not currently on any medications routinely.  He has occasional GERD and will take a Pepcid once in a while for this.  He has noticed for about a year intermittent left-sided chest wall pain.  It is not associated to exertion, not positional, no associated symptoms such as shortness of breath, dizziness, palpitations.  He does not smoke.  He is not obese.  His family history is significant for a maternal grandfather who died at the age of 28 from coronary artery disease.  He is used to being very active.  Right before COVID he participated in an Ironman.  He admits to being less physically active over the last few months as he has been recovering from COVID.  He will golf occasionally.  He has not had labs here in about 5 years.  He tells me that he does have a physical yearly at the [facility] and was told at his last couple appointments that he did have a high cholesterol.  He was tried on fish oil without much improvement.   Past Medical/Surgical History: Past Medical History:  Diagnosis Date  . GERD (gastroesophageal reflux disease)     No past surgical history on file.  Social History:  reports that he has never smoked. He has never used smokeless tobacco. No history on file for alcohol use and drug use.  Allergies: No Known Allergies  Family History:  Family History  Problem Relation Age of Onset  . Alcohol abuse Mother   . Hypertension Mother   . Hyperlipidemia  Mother   . Alcohol abuse Father   . Hypertension Father   . Hyperlipidemia Father   . Alcohol abuse Maternal Grandmother   . Hypertension Maternal Grandmother      Current Outpatient Medications:  .  Cholecalciferol (VITAMIN D3 PO), Take 50 mcg by mouth daily., Disp: , Rfl:  .  fexofenadine (ALLEGRA ALLERGY) 180 MG tablet, Take 1 tablet (180 mg total) by mouth daily., Disp: 30 tablet, Rfl: 11 .  fluticasone (FLONASE) 50 MCG/ACT nasal spray, Place 1 spray into both nostrils 2 (two) times daily., Disp: 16 g, Rfl: 11 .  NIACIN, ANTIHYPERLIPIDEMIC, PO, Take by mouth. (Patient not taking: Reported on 09/20/2020), Disp: , Rfl:  .  Omega-3 Fatty Acids (FISH OIL PO), Take 1,000 mg by mouth.  (Patient not taking: Reported on 09/20/2020), Disp: , Rfl:  .  TURMERIC PO, Take 530 mg by mouth., Disp: , Rfl:  .  valACYclovir (VALTREX) 1000 MG tablet, USE AS DIRECTED IF NEEDED FOR COLD SORES, Disp: 60 tablet, Rfl: 1  Review of Systems:  Constitutional: Denies fever, chills, diaphoresis, appetite change and fatigue.  HEENT: Denies photophobia, eye pain, redness, hearing loss, ear pain, congestion, sore throat, rhinorrhea, sneezing, mouth sores, trouble swallowing, neck pain, neck stiffness and tinnitus.   Respiratory: Denies SOB, DOE, cough,  and wheezing.   Cardiovascular: Denies palpitations and leg swelling.  Gastrointestinal: Denies nausea, vomiting, abdominal pain, diarrhea, constipation, blood in stool  and abdominal distention.  Genitourinary: Denies dysuria, urgency, frequency, hematuria, flank pain and difficulty urinating.  Endocrine: Denies: hot or cold intolerance, sweats, changes in hair or nails, polyuria, polydipsia. Musculoskeletal: Denies myalgias, back pain, joint swelling, arthralgias and gait problem.  Skin: Denies pallor, rash and wound.  Neurological: Denies dizziness, seizures, syncope, weakness, light-headedness, numbness and headaches.  Hematological: Denies adenopathy. Easy  bruising, personal or family bleeding history  Psychiatric/Behavioral: Denies suicidal ideation, mood changes, confusion, nervousness, sleep disturbance and agitation    Physical Exam: Vitals:   09/20/20 1508  BP: 120/84  Pulse: 81  Temp: 98.1 F (36.7 C)  TempSrc: Oral  SpO2: 98%  Weight: 195 lb 8 oz (88.7 kg)    Body mass index is 29.29 kg/m.  Constitutional: NAD, calm, comfortable Eyes: PERRL, lids and conjunctivae normal ENMT: Mucous membranes are moist. Respiratory: clear to auscultation bilaterally, no wheezing, no crackles. Normal respiratory effort. No accessory muscle use.  Cardiovascular: Regular rate and rhythm, no murmurs / rubs / gallops. No extremity edema.  Neurologic: Grossly intact and nonfocal Psychiatric: Normal judgment and insight. Alert and oriented x 3. Normal mood.    Impression and Plan:  Chest pain, unspecified type  Gastroesophageal reflux disease without esophagitis  -Chest pain is very atypical.  His only CAD risk factors are his possible hyperlipidemia (unclear values) and his family history with a grandfather having had a fulminant MI in his 71s. -He has been pretty active until recently, chest pain is nonexertional.  He feels like this is not typical GERD pain and he has in fact taken a Pepcid occasionally when he gets this pain without much relief. -EKG done in office today and interpreted by myself as sinus rhythm, no acute ST or T wave changes.  There is a lot of baseline artifact in some leads. -I am not concerned for CAD today given his presentation and EKG. -I do believe he needs to schedule follow-up with his PCP for further risk stratification, consider repeating cholesterol panel if he is unable to obtain recent values from the [facility].    Chaya Jan, MD [facility]

## 2020-09-22 ENCOUNTER — Ambulatory Visit: Payer: No Typology Code available for payment source | Admitting: Family Medicine

## 2020-09-26 ENCOUNTER — Other Ambulatory Visit: Payer: Self-pay

## 2020-09-26 ENCOUNTER — Encounter: Payer: No Typology Code available for payment source | Admitting: Family Medicine

## 2020-09-27 ENCOUNTER — Ambulatory Visit (INDEPENDENT_AMBULATORY_CARE_PROVIDER_SITE_OTHER): Payer: No Typology Code available for payment source | Admitting: Family Medicine

## 2020-09-27 ENCOUNTER — Encounter: Payer: Self-pay | Admitting: Family Medicine

## 2020-09-27 VITALS — BP 104/78 | HR 58 | Ht 68.5 in | Wt 194.0 lb

## 2020-09-27 DIAGNOSIS — Z0000 Encounter for general adult medical examination without abnormal findings: Secondary | ICD-10-CM | POA: Diagnosis not present

## 2020-09-27 DIAGNOSIS — R0789 Other chest pain: Secondary | ICD-10-CM | POA: Diagnosis not present

## 2020-09-27 NOTE — Patient Instructions (Signed)
Coronary Calcium Scan A coronary calcium scan is an imaging test used to look for deposits of plaque in the inner lining of the blood vessels of the heart (coronary arteries). Plaque is made up of calcium, protein, and fatty substances. These deposits of plaque can partly clog and narrow the coronary arteries without producing any symptoms or warning signs. This puts a person at risk for a heart attack. This test is recommended for people who are at moderate risk for heart disease. The test can find plaque deposits before symptoms develop. Tell a health care provider about:  Any allergies you have.  All medicines you are taking, including vitamins, herbs, eye drops, creams, and over-the-counter medicines.  Any problems you or family members have had with anesthetic medicines.  Any blood disorders you have.  Any surgeries you have had.  Any medical conditions you have.  Whether you are pregnant or may be pregnant. What are the risks? Generally, this is a safe procedure. However, problems may occur, including:  Harm to a pregnant woman and her unborn baby. This test involves the use of radiation. Radiation exposure can be dangerous to a pregnant woman and her unborn baby. If you are pregnant or think you may be pregnant, you should not have this procedure done.  Slight increase in the risk of cancer. This is because of the radiation involved in the test. What happens before the procedure? Ask your health care provider for any specific instructions on how to prepare for this procedure. You may be asked to avoid products that contain caffeine, tobacco, or nicotine for 4 hours before the procedure. What happens during the procedure?  You will undress and remove any jewelry from your neck or chest.  You will put on a hospital gown.  Sticky electrodes will be placed on your chest. The electrodes will be connected to an electrocardiogram (ECG) machine to record a tracing of the electrical  activity of your heart.  You will lie down on a curved bed that is attached to the CT scanner.  You may be given medicine to slow down your heart rate so that clear pictures can be created.  You will be moved into the CT scanner, and the CT scanner will take pictures of your heart. During this time, you will be asked to lie still and hold your breath for 2-3 seconds at a time while each picture of your heart is being taken. The procedure may vary among health care providers and hospitals.   What happens after the procedure?  You can get dressed.  You can return to your normal activities.  It is up to you to get the results of your procedure. Ask your health care provider, or the department that is doing the procedure, when your results will be ready. Summary  A coronary calcium scan is an imaging test used to look for deposits of plaque in the inner lining of the blood vessels of the heart (coronary arteries). Plaque is made up of calcium, protein, and fatty substances.  Generally, this is a safe procedure. Tell your health care provider if you are pregnant or may be pregnant.  Ask your health care provider for any specific instructions on how to prepare for this procedure.  A CT scanner will take pictures of your heart.  You can return to your normal activities after the scan is done. This information is not intended to replace advice given to you by your health care provider. Make sure you discuss   any questions you have with your health care provider. Document Revised: 03/09/2019 Document Reviewed: 03/09/2019 Elsevier Patient Education  2021 Elsevier Inc.  

## 2020-09-27 NOTE — Progress Notes (Unsigned)
Established Patient Office Visit  Subjective:  Patient ID: Gregory Sparks, male    DOB: 10/08/1980  Age: 40 y.o. MRN: [id number]  CC:  Chief Complaint  Patient presents with  . Annual Exam    HPI Gregory Sparks presents for annual physical exam.  He is generally fairly healthy.  Has had some issues with GERD in the past and perennial allergies.  He had some recent atypical chest pain.  Saw a colleague of mine for that and had EKG which was unremarkable.  Symptoms very atypical- usually sharp quality pain lasting seconds to a few minutes and not associated with exercise.  These occurred at rest.  He has not been doing a lot of exercise over the past few months but when exercising fairly vigorously up until few months ago was not having any associated pain.  Has gotten lab work through the [employer] health system from his prior [employer] career in the past.  No recent labs here.  Has had some increased stress recently related to life change.  His wife is a Publishing rights manager and is taking a position in [place].  Gregory Sparks will be also changing jobs.  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  They are generally excited about the move but very busy with planning.  Health maintenance reviewed  -Tetanus due 2029 -Covid vaccines complete -Declines flu vaccine  Family history-significant for maternal grandfather with MI at age 57.  His mother and father have history of alcoholism, hypertension, and hyperlipidemia.  Social history-he is married.  No children.  Wife is a Publishing rights manager.  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  Non-smoker.  Only occasional alcohol use.  Usually about 3 beers per week.  They just recently sold her house and currently living in apartment until the move this spring.   Past Medical History:  Diagnosis Date  . GERD (gastroesophageal reflux disease)     No past surgical history on file.  Family History  Problem Relation Age of Onset  .  Alcohol abuse Mother   . Hypertension Mother   . Hyperlipidemia Mother   . Alcohol abuse Father   . Hypertension Father   . Hyperlipidemia Father   . Alcohol abuse Maternal Grandmother   . Hypertension Maternal Grandmother     Social History   Socioeconomic History  . Marital status: Married    Spouse name: Not on file  . Number of children: Not on file  . Years of education: Not on file  . Highest education level: Not on file  Occupational History  . Not on file  Tobacco Use  . Smoking status: Never Smoker  . Smokeless tobacco: Never Used  Vaping Use  . Vaping Use: Never used  Substance and Sexual Activity  . Alcohol use: Not on file  . Drug use: Not on file  . Sexual activity: Not on file  Other Topics Concern  . Not on file  Social History Narrative  . Not on file   Social Determinants of Health   Financial Resource Strain: Not on file  Food Insecurity: Not on file  Transportation Needs: Not on file  Physical Activity: Not on file  Stress: Not on file  Social Connections: Not on file  Intimate Partner Violence: Not on file    Outpatient Medications Prior to Visit  Medication Sig Dispense Refill  . Cholecalciferol (VITAMIN D3 PO) Take 50 mcg by mouth daily.    . fexofenadine (ALLEGRA ALLERGY) 180 MG  tablet Take 1 tablet (180 mg total) by mouth daily. 30 tablet 11  . fluticasone (FLONASE) 50 MCG/ACT nasal spray Place 1 spray into both nostrils 2 (two) times daily. 16 g 11  . TURMERIC PO Take 530 mg by mouth.    . valACYclovir (VALTREX) 1000 MG tablet USE AS DIRECTED IF NEEDED FOR COLD SORES 60 tablet 1  . NIACIN, ANTIHYPERLIPIDEMIC, PO Take by mouth. (Patient not taking: Reported on 09/20/2020)    . Omega-3 Fatty Acids (FISH OIL PO) Take 1,000 mg by mouth.  (Patient not taking: Reported on 09/20/2020)     No facility-administered medications prior to visit.    No Known Allergies  ROS Review of Systems  Constitutional: Negative for activity change, appetite  change, fatigue and fever.  HENT: Negative for congestion, ear pain and trouble swallowing.   Eyes: Negative for pain and visual disturbance.  Respiratory: Negative for cough, shortness of breath and wheezing.   Cardiovascular: Negative for chest pain and palpitations.       See HPI  Gastrointestinal: Negative for abdominal distention, abdominal pain, blood in stool, constipation, diarrhea, nausea, rectal pain and vomiting.  Genitourinary: Negative for dysuria, hematuria and testicular pain.  Musculoskeletal: Negative for arthralgias and joint swelling.  Skin: Negative for rash.  Neurological: Negative for dizziness, syncope and headaches.  Hematological: Negative for adenopathy.  Psychiatric/Behavioral: Negative for confusion and dysphoric mood.      Objective:    Physical Exam Constitutional:      General: He is not in acute distress.    Appearance: He is well-developed and well-nourished.  HENT:     Head: Normocephalic and atraumatic.     Right Ear: External ear normal.     Left Ear: External ear normal.     Mouth/Throat:     Mouth: Oropharynx is clear and moist.  Eyes:     Extraocular Movements: EOM normal.     Conjunctiva/sclera: Conjunctivae normal.     Pupils: Pupils are equal, round, and reactive to light.  Neck:     Thyroid: No thyromegaly.  Cardiovascular:     Rate and Rhythm: Normal rate and regular rhythm.     Heart sounds: Normal heart sounds. No murmur heard.   Pulmonary:     Effort: No respiratory distress.     Breath sounds: No wheezing or rales.  Abdominal:     General: Bowel sounds are normal. There is no distension.     Palpations: Abdomen is soft. There is no mass.     Tenderness: There is no abdominal tenderness. There is no guarding or rebound.  Musculoskeletal:        General: No edema.     Cervical back: Normal range of motion and neck supple.     Right lower leg: No edema.     Left lower leg: No edema.  Lymphadenopathy:     Cervical: No  cervical adenopathy.  Skin:    Findings: No rash.  Neurological:     Mental Status: He is alert and oriented to person, place, and time.     Cranial Nerves: No cranial nerve deficit.     Deep Tendon Reflexes: Reflexes normal.  Psychiatric:        Mood and Affect: Mood and affect normal.     BP 104/78   Pulse (!) 58   Ht 5' 8.5" (1.74 m)   Wt 194 lb (88 kg)   SpO2 98%   BMI 29.07 kg/m  Wt Readings from Last 3 Encounters:  09/27/20 194 lb (88 kg)  09/20/20 195 lb 8 oz (88.7 kg)  05/11/19 195 lb (88.5 kg)     Health Maintenance Due  Topic Date Due  . Hepatitis C Screening  Never done  . HIV Screening  Never done    There are no preventive care reminders to display for this patient.  Lab Results  Component Value Date   TSH 1.64 11/10/2015   Lab Results  Component Value Date   WBC 5.8 11/10/2015   HGB 14.9 11/10/2015   HCT 44.6 11/10/2015   MCV 88.1 11/10/2015   PLT 205 11/10/2015   Lab Results  Component Value Date   NA 139 11/10/2015   K 4.3 11/10/2015   CO2 28 11/10/2015   GLUCOSE 90 11/10/2015   BUN 14 11/10/2015   CREATININE 0.85 11/10/2015   BILITOT 0.4 11/10/2015   ALKPHOS 33 (L) 11/10/2015   AST 20 11/10/2015   ALT 16 11/10/2015   PROT 7.2 11/10/2015   ALBUMIN 4.5 11/10/2015   CALCIUM 9.4 11/10/2015   Lab Results  Component Value Date   CHOL 191 11/10/2015   Lab Results  Component Value Date   HDL 32 (L) 11/10/2015   Lab Results  Component Value Date   LDLCALC 136 (H) 11/10/2015   Lab Results  Component Value Date   TRIG 113 11/10/2015   Lab Results  Component Value Date   CHOLHDL 6.0 (H) 11/10/2015   No results found for: HGBA1C    Assessment & Plan:   Problem List Items Addressed This Visit   None   Visit Diagnoses    Physical exam    -  Primary   Relevant Orders   Basic metabolic panel   Lipid panel   CBC with Differential/Platelet   TSH   Hepatic function panel    Generally healthy 40 year old male.  He has had  some recent atypical type chest pains.  No history of exertional chest pain.  Reported past history of hyperlipidemia and positive family history of heart disease in maternal grandfather.  -We discussed primary measures to reduce heart disease with low saturated fat diet, regular exercise, weight control, blood pressure control -We discussed possible further risk factor stratification -Consider coronary calcium score.  We discussed this and he is interested.  We usually reserve for patients over 40 with exception of diabetics but because of affordability locally and potential genetic risk he would like to proceed with this if possible.  No orders of the defined types were placed in this encounter.   Follow-up: No follow-ups on file.    Evelena Peat, MD

## 2020-09-28 LAB — BASIC METABOLIC PANEL
BUN: 10 mg/dL (ref 6–23)
CO2: 31 mEq/L (ref 19–32)
Calcium: 10.1 mg/dL (ref 8.4–10.5)
Chloride: 102 mEq/L (ref 96–112)
Creatinine, Ser: 0.94 mg/dL (ref 0.40–1.50)
GFR: 102.98 mL/min (ref >60.00)
Glucose, Bld: 76 mg/dL (ref 70–99)
Potassium: 3.9 mEq/L (ref 3.5–5.1)
Sodium: 139 mEq/L (ref 135–145)

## 2020-09-28 LAB — LIPID PANEL
Cholesterol: 204 mg/dL — ABNORMAL HIGH (ref 0–200)
HDL: 38.2 mg/dL — ABNORMAL LOW (ref >39.00)
LDL Cholesterol: 143 mg/dL — ABNORMAL HIGH (ref 0–99)
NonHDL: 166
Total CHOL/HDL Ratio: 5
Triglycerides: 114 mg/dL (ref 0.0–149.0)
VLDL: 22.8 mg/dL (ref 0.0–40.0)

## 2020-09-28 LAB — CBC WITH DIFFERENTIAL/PLATELET
Basophils Absolute: 0.1 10*3/uL (ref 0.0–0.1)
Basophils Relative: 1.1 % (ref 0.0–3.0)
Eosinophils Absolute: 0.1 10*3/uL (ref 0.0–0.7)
Eosinophils Relative: 2.2 % (ref 0.0–5.0)
HCT: 42.4 % (ref 39.0–52.0)
Hemoglobin: 14.3 g/dL (ref 13.0–17.0)
Lymphocytes Relative: 40 % (ref 12.0–46.0)
Lymphs Abs: 2.5 10*3/uL (ref 0.7–4.0)
MCHC: 33.9 g/dL (ref 30.0–36.0)
MCV: 89.3 fl (ref 78.0–100.0)
Monocytes Absolute: 0.6 10*3/uL (ref 0.1–1.0)
Monocytes Relative: 9.1 % (ref 3.0–12.0)
Neutro Abs: 3 10*3/uL (ref 1.4–7.7)
Neutrophils Relative %: 47.6 % (ref 43.0–77.0)
Platelets: 189 10*3/uL (ref 150.0–400.0)
RBC: 4.74 Mil/uL (ref 4.22–5.81)
RDW: 12.5 % (ref 11.5–15.5)
WBC: 6.4 10*3/uL (ref 4.0–10.5)

## 2020-09-28 LAB — HEPATIC FUNCTION PANEL
ALT: 17 U/L (ref 0–53)
AST: 23 U/L (ref 0–37)
Albumin: 4.8 g/dL (ref 3.5–5.2)
Alkaline Phosphatase: 39 U/L (ref 39–117)
Bilirubin, Direct: 0.1 mg/dL (ref 0.0–0.3)
Total Bilirubin: 0.8 mg/dL (ref 0.2–1.2)
Total Protein: 7.8 g/dL (ref 6.0–8.3)

## 2020-09-28 LAB — TSH: TSH: 1.42 u[IU]/mL (ref 0.35–4.50)

## 2020-10-03 ENCOUNTER — Ambulatory Visit (INDEPENDENT_AMBULATORY_CARE_PROVIDER_SITE_OTHER)
Admission: RE | Admit: 2020-10-03 | Discharge: 2020-10-03 | Disposition: A | Discharge status: Home / Self Care | Payer: Self-pay | Source: Ambulatory Visit | Attending: Family Medicine | Admitting: Family Medicine

## 2020-10-03 ENCOUNTER — Other Ambulatory Visit: Payer: Self-pay

## 2020-10-03 DIAGNOSIS — R0789 Other chest pain: Secondary | ICD-10-CM

## 2020-10-04 NOTE — Progress Notes (Signed)
Mychart message sent: Coronary calcium score of 0 which is very good.  Very low risk for underlying occult coronary disease

## 2021-05-10 IMAGING — DX LUMBAR SPINE - COMPLETE 4+ VIEW
5 series · 5 of 5 positions shown · non-contrast
Comparison: None.

CLINICAL DATA: Worsening low back pain over the past 2 weeks.

EXAM:
LUMBAR SPINE - COMPLETE 4+ VIEW

[l-spine ap]
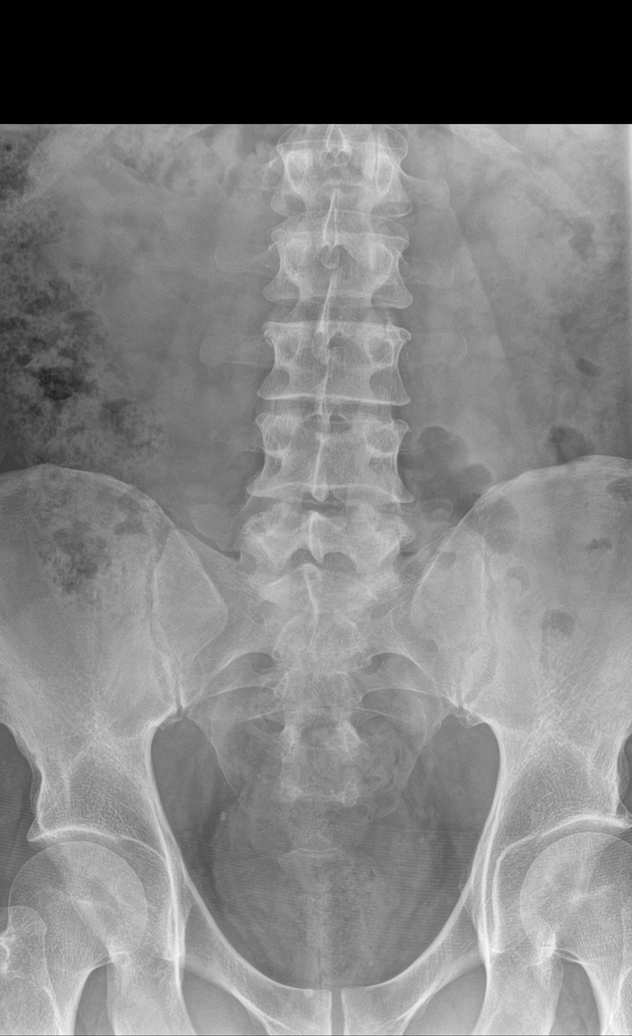

[l-spine obl (1 of 2)]
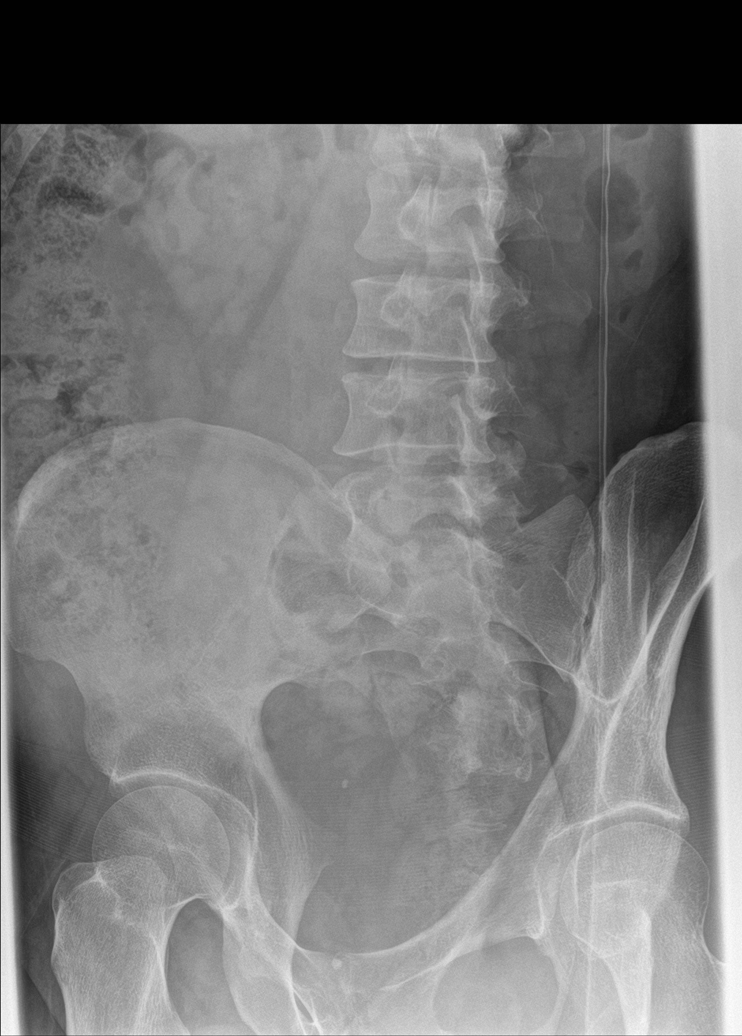

[l-spine obl (2 of 2)]
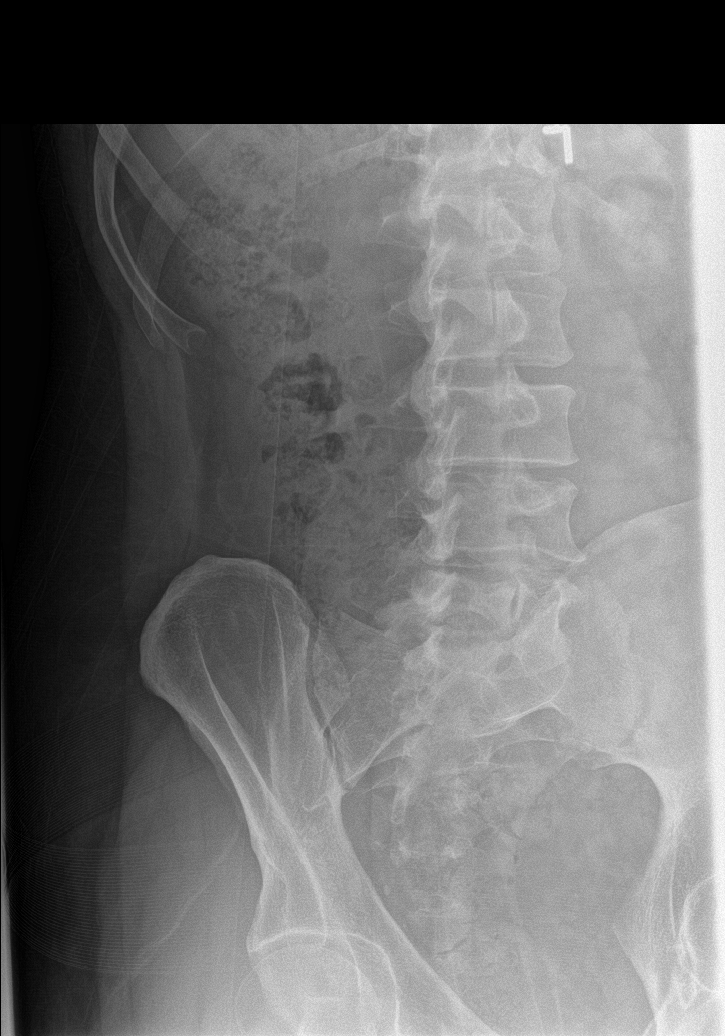

[l-spine lat]
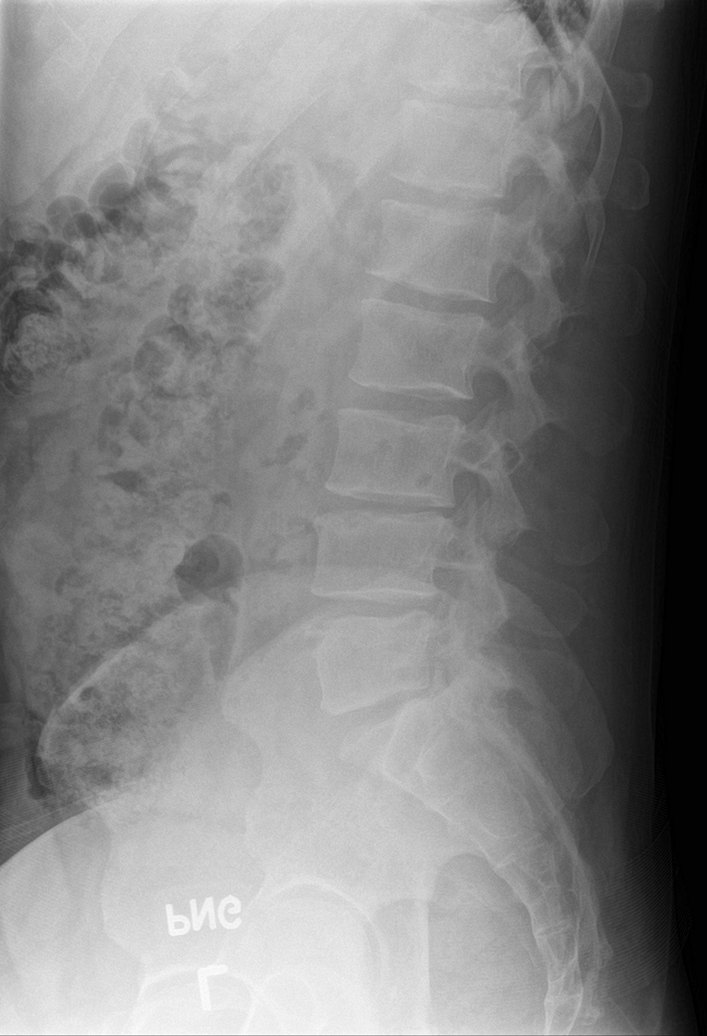

[l-spine spot]
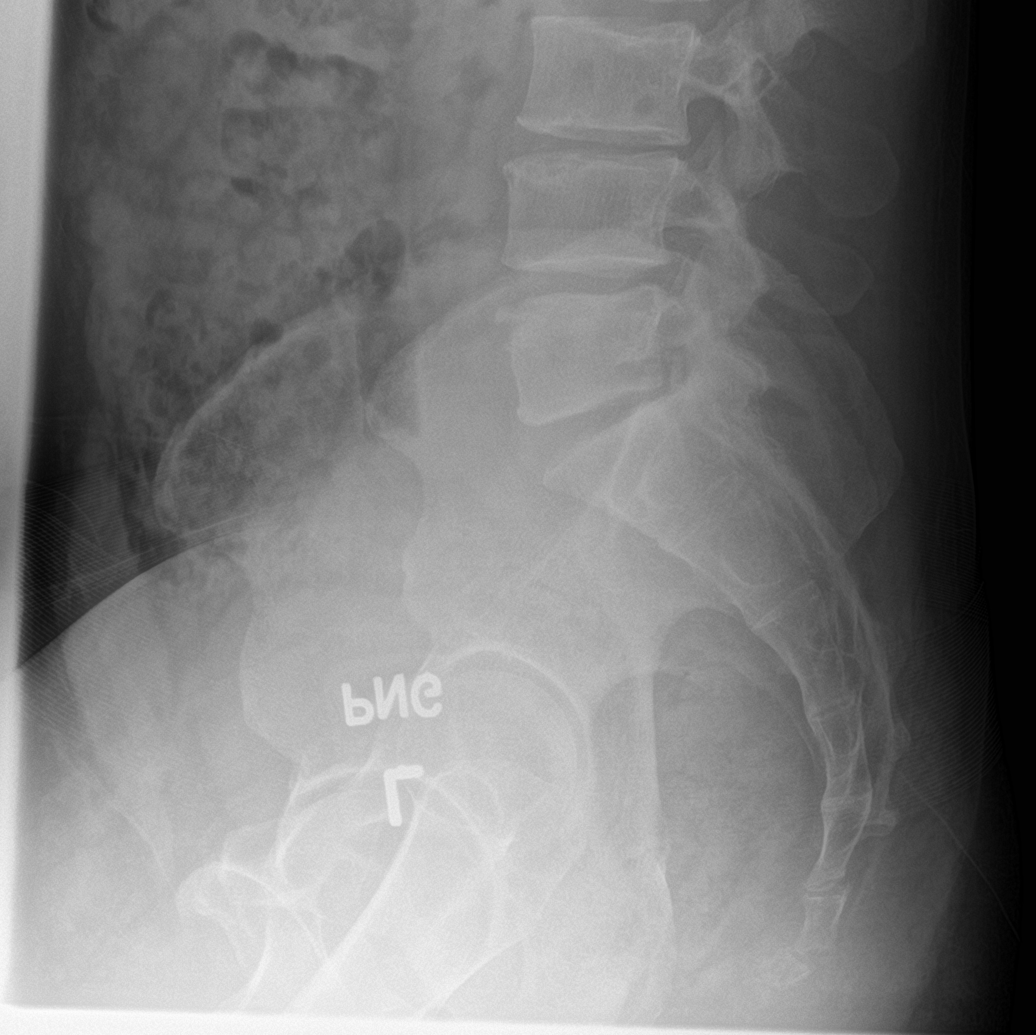

[5 of 5 positions shown; findings below may reference images not displayed]

FINDINGS: There is straightening of the normal lumbar lordosis. Vertebral body
height and alignment are maintained. Loss of disc space height is
seen at L3-4, L4-5 and L5-S1. Facet degenerative disease L4-5 and
L5-S1 noted.
IMPRESSION: Lumbar spondylosis L3-S1.  No acute abnormality.

## 2023-09-09 ENCOUNTER — Telehealth: Payer: Self-pay | Admitting: Family Medicine

## 2023-09-09 NOTE — Telephone Encounter (Signed)
 Called and left VM to schedule Physical, last physical was 01/22.

## 2023-09-11 ENCOUNTER — Telehealth: Payer: Self-pay | Admitting: Family Medicine

## 2023-09-11 ENCOUNTER — Telehealth: Payer: Self-pay | Admitting: *Deleted

## 2023-09-11 NOTE — Telephone Encounter (Signed)
 Copied from CRM [id number]. Topic: General - Other >> Sep 11, 2023 11:44 AM Joen NOVAK wrote: Reason for CRM: PATIENT STATES HE IS GETTING CALLS FROM THE OFFICE LETTING HIM KNOW IT IS TIME FOR A FOLLOW VISIT. PATIENT CALLED TO INFORM THAT HE NOW LIVES IN [place]  AND NO LONGER NEED SERVICES FROM THIS CLINIC  Noted Dr Micheal removed.
